# Patient Record
Sex: Male | Born: 1965 | Race: White | Hispanic: No | Smoking: Former smoker
Health system: Southern US, Community
[De-identification: ages and names within clinical notes are randomized; demographics above are authoritative.]

## PROBLEM LIST (undated history)

## (undated) DIAGNOSIS — F419 Anxiety disorder, unspecified: Secondary | ICD-10-CM

## (undated) DIAGNOSIS — E785 Hyperlipidemia, unspecified: Secondary | ICD-10-CM

## (undated) DIAGNOSIS — Z86010 Personal history of colonic polyps: Secondary | ICD-10-CM

## (undated) DIAGNOSIS — F329 Major depressive disorder, single episode, unspecified: Secondary | ICD-10-CM

## (undated) DIAGNOSIS — F32A Depression, unspecified: Secondary | ICD-10-CM

## (undated) HISTORY — DX: Hyperlipidemia, unspecified: E78.5

## (undated) HISTORY — DX: Depression, unspecified: F32.A

## (undated) HISTORY — PX: OTHER SURGICAL HISTORY: SHX169

## (undated) HISTORY — PX: SHOULDER ARTHROSCOPY: SHX128

## (undated) HISTORY — DX: Major depressive disorder, single episode, unspecified: F32.9

## (undated) HISTORY — PX: VASECTOMY: SHX75

---
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
## 2005-01-02 ENCOUNTER — Ambulatory Visit: Payer: Self-pay | Admitting: Unknown Physician Specialty

## 2005-04-13 ENCOUNTER — Ambulatory Visit: Payer: Self-pay

## 2005-07-13 ENCOUNTER — Ambulatory Visit: Payer: Self-pay | Admitting: Urology

## 2006-05-22 ENCOUNTER — Other Ambulatory Visit: Payer: Self-pay

## 2006-05-22 ENCOUNTER — Emergency Department: Payer: Self-pay | Admitting: Emergency Medicine

## 2007-11-12 ENCOUNTER — Ambulatory Visit: Payer: Self-pay | Admitting: Unknown Physician Specialty

## 2007-12-06 ENCOUNTER — Ambulatory Visit: Payer: Self-pay | Admitting: Unknown Physician Specialty

## 2008-02-20 ENCOUNTER — Ambulatory Visit: Payer: Self-pay

## 2008-04-09 ENCOUNTER — Ambulatory Visit: Payer: Self-pay | Admitting: Orthopaedic Surgery

## 2008-04-16 ENCOUNTER — Ambulatory Visit: Payer: Self-pay | Admitting: Orthopaedic Surgery

## 2014-02-17 DIAGNOSIS — K625 Hemorrhage of anus and rectum: Secondary | ICD-10-CM | POA: Insufficient documentation

## 2014-02-17 DIAGNOSIS — K602 Anal fissure, unspecified: Secondary | ICD-10-CM | POA: Insufficient documentation

## 2014-04-11 HISTORY — PX: COLONOSCOPY: SHX174

## 2014-04-28 DIAGNOSIS — Z86010 Personal history of colonic polyps: Secondary | ICD-10-CM

## 2014-04-28 DIAGNOSIS — Z860101 Personal history of adenomatous and serrated colon polyps: Secondary | ICD-10-CM | POA: Insufficient documentation

## 2014-04-28 HISTORY — DX: Personal history of colonic polyps: Z86.010

## 2014-04-28 HISTORY — DX: Personal history of adenomatous and serrated colon polyps: Z86.0101

## 2015-07-28 DIAGNOSIS — N50819 Testicular pain, unspecified: Secondary | ICD-10-CM | POA: Insufficient documentation

## 2015-07-28 DIAGNOSIS — Z9852 Vasectomy status: Secondary | ICD-10-CM | POA: Insufficient documentation

## 2015-11-22 ENCOUNTER — Other Ambulatory Visit: Payer: Self-pay | Admitting: Neurology

## 2015-11-22 DIAGNOSIS — R42 Dizziness and giddiness: Secondary | ICD-10-CM

## 2015-12-10 ENCOUNTER — Ambulatory Visit
Admission: RE | Admit: 2015-12-10 | Discharge: 2015-12-10 | Disposition: A | Discharge status: Home / Self Care | Payer: 59 | Source: Ambulatory Visit | Attending: Neurology | Admitting: Neurology

## 2015-12-10 DIAGNOSIS — R200 Anesthesia of skin: Secondary | ICD-10-CM | POA: Insufficient documentation

## 2015-12-10 DIAGNOSIS — R42 Dizziness and giddiness: Secondary | ICD-10-CM | POA: Insufficient documentation

## 2015-12-10 MED ORDER — GADOBENATE DIMEGLUMINE 529 MG/ML IV SOLN
15.0000 mL | Freq: Once | INTRAVENOUS | Status: AC | PRN
  Administered 2015-12-10: 15 mL via INTRAVENOUS

## 2016-02-21 ENCOUNTER — Other Ambulatory Visit: Payer: Self-pay

## 2016-02-21 ENCOUNTER — Emergency Department
Admission: EM | Admit: 2016-02-21 | Discharge: 2016-02-21 | Disposition: A | Discharge status: Home / Self Care | Payer: 59 | Attending: Emergency Medicine | Admitting: Emergency Medicine

## 2016-02-21 ENCOUNTER — Encounter: Payer: Self-pay | Admitting: Emergency Medicine

## 2016-02-21 DIAGNOSIS — R42 Dizziness and giddiness: Secondary | ICD-10-CM | POA: Diagnosis present

## 2016-02-21 DIAGNOSIS — N341 Nonspecific urethritis: Secondary | ICD-10-CM | POA: Diagnosis not present

## 2016-02-21 DIAGNOSIS — N342 Other urethritis: Secondary | ICD-10-CM

## 2016-02-21 HISTORY — DX: Anxiety disorder, unspecified: F41.9

## 2016-02-21 LAB — CBC
HCT: 44.2 % (ref 40.0–52.0)
Hemoglobin: 15.5 g/dL (ref 13.0–18.0)
MCH: 31.4 pg (ref 26.0–34.0)
MCHC: 35.1 g/dL (ref 32.0–36.0)
MCV: 89.5 fL (ref 80.0–100.0)
PLATELETS: 186 10*3/uL (ref 150–440)
RBC: 4.94 MIL/uL (ref 4.40–5.90)
RDW: 13.4 % (ref 11.5–14.5)
WBC: 7.3 10*3/uL (ref 3.8–10.6)

## 2016-02-21 LAB — URINALYSIS COMPLETE WITH MICROSCOPIC (ARMC ONLY)
Bacteria, UA: NONE SEEN
Bilirubin Urine: NEGATIVE
GLUCOSE, UA: NEGATIVE mg/dL
HGB URINE DIPSTICK: NEGATIVE
Ketones, ur: NEGATIVE mg/dL
Leukocytes, UA: NEGATIVE
Nitrite: NEGATIVE
Protein, ur: NEGATIVE mg/dL
SQUAMOUS EPITHELIAL / LPF: NONE SEEN
Specific Gravity, Urine: 1.015 (ref 1.005–1.030)
pH: 5 (ref 5.0–8.0)

## 2016-02-21 LAB — BASIC METABOLIC PANEL
ANION GAP: 9 (ref 5–15)
BUN: 17 mg/dL (ref 6–20)
CALCIUM: 9.3 mg/dL (ref 8.9–10.3)
CHLORIDE: 105 mmol/L (ref 101–111)
CO2: 24 mmol/L (ref 22–32)
CREATININE: 1.28 mg/dL — AB (ref 0.61–1.24)
GFR calc non Af Amer: >60 mL/min (ref >60)
Glucose, Bld: 168 mg/dL — ABNORMAL HIGH (ref 65–99)
Potassium: 3.9 mmol/L (ref 3.5–5.1)
SODIUM: 138 mmol/L (ref 135–145)

## 2016-02-21 LAB — CHLAMYDIA/NGC RT PCR (ARMC ONLY)
CHLAMYDIA TR: NOT DETECTED
N gonorrhoeae: NOT DETECTED

## 2016-02-21 MED ORDER — CEFTRIAXONE SODIUM 250 MG IJ SOLR
250.0000 mg | Freq: Once | INTRAMUSCULAR | Status: AC
Start: 2016-02-21 — End: 2016-02-21
  Administered 2016-02-21: 250 mg via INTRAMUSCULAR

## 2016-02-21 MED ORDER — CEFTRIAXONE SODIUM 250 MG IJ SOLR
INTRAMUSCULAR | Status: AC
  Administered 2016-02-21: 250 mg via INTRAMUSCULAR
  Filled 2016-02-21: qty 250

## 2016-02-21 NOTE — ED Provider Notes (Signed)
[facility] Emergency Department Provider Note  Time seen: 2:54 PM  I have reviewed the triage vital signs and the nursing notes.   HISTORY  Chief Complaint Near Syncope    HPI Patrick Neal is a 50 y.o. male with a past medical history of anxiety presents to the emergency department with urethral burning, and generalized weakness for the past 5 months. According to the patient since January he has been having intermittent urethral burning, denies discharge. He also states intermittent sore throat since January as well, denies fever, nausea, vomiting, diarrhea, abdominal pain. Patient states he was actually active in January and since that episode he has been having these symptoms intermittently. He has been working with his primary care doctor, they've treated him with Zithromax and doxycycline without improvement. Patient states the urethral burning was somewhat worse and he came to the emergency department today for evaluation. Denies any discharge. Denies any dysuria.     Past Medical History  Diagnosis Date  . Anxiety     There are no active problems to display for this patient.   Past Surgical History  Procedure Laterality Date  . Shoulder arthroscopy    . Visectomy      No current outpatient prescriptions on file.  Allergies Review of patient's allergies indicates no known allergies.  No family history on file.  Social History Social History  Substance Use Topics  . Smoking status: Never Smoker   . Smokeless tobacco: None  . Alcohol Use: 9.0 oz/week    15 Cans of beer per week     Comment: only on the weekends    Review of Systems Constitutional: Negative for fever. Cardiovascular: Negative for chest pain. Respiratory: Negative for shortness of breath. Gastrointestinal: Negative for abdominal pain Genitourinary: Negative for dysuria.Positive for urethral burning at times, but not during urination. Musculoskeletal: Negative  for back pain. Neurological: Negative for headache 10-point ROS otherwise negative.  ____________________________________________   PHYSICAL EXAM:  VITAL SIGNS: ED Triage Vitals  Enc Vitals Group     BP 02/21/16 1215 153/74 mmHg     Pulse Rate 02/21/16 1215 73     Resp 02/21/16 1215 18     Temp 02/21/16 1215 98.2 F (36.8 C)     Temp src --      SpO2 02/21/16 1215 98 %     Weight 02/21/16 1215 158 lb (71.668 kg)     Height 02/21/16 1215 5\' 9"  (1.753 m)     Head Cir --      Peak Flow --      Pain Score 02/21/16 1216 6     Pain Loc --      Pain Edu? --      Excl. in [place]? --     Constitutional: Alert and oriented. Well appearing and in no distress. Eyes: Normal exam ENT   Head: Normocephalic and atraumatic.   Mouth/Throat: Mucous membranes are moist.No pharyngeal erythema or exudate. Cardiovascular: Normal rate, regular rhythm. No murmur Respiratory: Normal respiratory effort without tachypnea nor retractions. Breath sounds are clear  Gastrointestinal: Soft and nontender. No distention.   Genitourinary: Normal examination. Normal testicular exam, no masses. No urethral discharge. No lesions identified. Normal circumcised exam. Musculoskeletal: Nontender with normal range of motion in all extremities. Neurologic:  Normal speech and language. No gross focal neurologic deficits  Skin:  Skin is warm, dry and intact.  Psychiatric: Mood and affect are normal.  ____________________________________________     INITIAL IMPRESSION / ASSESSMENT AND  PLAN / ED COURSE  Pertinent labs & imaging results that were available during my care of the patient were reviewed by me and considered in my medical decision making (see chart for details).  The patient presents for urethral burning, intermittent sore throat, tinnitus ML ongoing intermittently for the last 5 months since she was last sexually active. Patient states he has been treated with Zithromax and doxycycline by his primary  care doctor without improvement. States he's never been treated with a cephalosporin. Patient's labs are largely within normal limits. Patient has a normal physical exam. We will treat the patient with 250 mg of Rocephin. Patient received a Zithromax in February, he states 4 tablets at once, his last sexual encounter was in January. We will hold off on treatment with Zithromax, treat with Rocephin, send a GC chlamydia dirty urine sample, and have the patient follow up with his primary care doctor.  ____________________________________________   FINAL CLINICAL IMPRESSION(S) / ED DIAGNOSES  Urethritis   Harvest Dark, MD 02/21/16 1459

## 2016-02-21 NOTE — ED Notes (Addendum)
Pt to ED with increasing dizziness over the last few weeks.  Pt also states he has had "what feels like a virus for months", including sore throat and burning urethra (not with urination).  Denies n/v/d.  Pt endorses decreased appetite and has lost 5lbs in the last 2 weeks.

## 2016-02-21 NOTE — Discharge Instructions (Signed)
Urethritis, Adult °Urethritis is an inflammation of the tube through which urine exits your bladder (urethra).  °CAUSES °Urethritis is often caused by an infection in your urethra. The infection can be viral, like herpes. The infection can also be bacterial, like gonorrhea. °RISK FACTORS °Risk factors of urethritis include: °· Having sex without using a condom. °· Having multiple sexual partners. °· Having poor hygiene. °SIGNS AND SYMPTOMS °Symptoms of urethritis are less noticeable in women than in men. These symptoms include: °· Burning feeling when you urinate (dysuria). °· Discharge from your urethra. °· Blood in your urine (hematuria). °· Urinating more than usual. °DIAGNOSIS  °To confirm a diagnosis of urethritis, your health care provider will do the following: °· Ask about your sexual history. °· Perform a physical exam. °· Have you provide a sample of your urine for lab testing. °· Use a cotton swab to gently collect a sample from your urethra for lab testing. °TREATMENT  °It is important to treat urethritis. Depending on the cause, untreated urethritis may lead to serious genital infections and possibly infertility. Urethritis caused by a bacterial infection is treated with antibiotic medicine. All sexual partners must be treated.  °HOME CARE INSTRUCTIONS °· Do not have sex until the test results are known and treatment is completed, even if your symptoms go away before you finish treatment. °· If you were prescribed an antibiotic, finish it all even if you start to feel better. °SEEK MEDICAL CARE IF:  °· Your symptoms are not improved in 3 days. °· Your symptoms are getting worse. °· You develop abdominal pain or pelvic pain (in women). °· You develop joint pain. °· You have a fever. °SEEK IMMEDIATE MEDICAL CARE IF:  °· You have severe pain in the belly, back, or side. °· You have repeated vomiting. °MAKE SURE YOU: °· Understand these instructions. °· Will watch your condition. °· Will get help right away  if you are not doing well or get worse. °  °This information is not intended to replace advice given to you by your health care provider. Make sure you discuss any questions you have with your health care provider. °  °Document Released: 02/21/2001 Document Revised: 01/12/2015 Document Reviewed: 04/28/2013 °Elsevier Interactive Patient Education ©2016 [employer] ° °

## 2016-08-08 ENCOUNTER — Encounter: Payer: Self-pay | Admitting: Psychiatry

## 2016-08-08 ENCOUNTER — Ambulatory Visit (INDEPENDENT_AMBULATORY_CARE_PROVIDER_SITE_OTHER): Payer: 59 | Admitting: Psychiatry

## 2016-08-08 VITALS — BP 121/77 | HR 63 | Temp 97.1°F | Resp 16 | Wt 160.6 lb

## 2016-08-08 DIAGNOSIS — F330 Major depressive disorder, recurrent, mild: Secondary | ICD-10-CM | POA: Diagnosis not present

## 2016-08-08 DIAGNOSIS — F5101 Primary insomnia: Secondary | ICD-10-CM | POA: Diagnosis not present

## 2016-08-08 DIAGNOSIS — F411 Generalized anxiety disorder: Secondary | ICD-10-CM

## 2016-08-08 MED ORDER — QUETIAPINE FUMARATE 25 MG PO TABS: 25.0000 mg | ORAL_TABLET | Freq: Every day | ORAL | 2 refills | Status: DC

## 2016-08-08 NOTE — Progress Notes (Signed)
Psychiatric Initial Adult Assessment   Patient Identification: Patrick Neal MRN:  [id number] Date of Evaluation:  08/08/2016 Referral Source: Carolin Guernsey Chief Complaint:   Chief Complaint    Stress; Depression     Visit Diagnosis:    ICD-9-CM ICD-10-CM   1. GAD (generalized anxiety disorder) 300.02 F41.1   2. 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 Primary insomnia 307.42 F51.01     History of Present Illness:  Patient is a 50 year old Caucasian male who presents today for an evaluation for depression and insomnia. Patient reports that he has had insomnia for a long time starting when he was 50 years old. States that he has been treated with various medications including trazodone which did not work for him. Currently he takes Ambien 10 mg. Patient reports that over the last 2 years since he got divorced things have been a bit more  stressful. States states that he has a good job but finances are tight and he lives with his 20 year old son. He states that he and his ex-wife have joint custody of their son but it's an amicable relationship. Currently patient is endorsing depressed mood. Reports anhedonia and not enjoying anything in life. States that he does not feel like his motivation to do anything. States that he feels like he just sits on the couch most of the time. He is also busy taking care of his son. He is able to function at his job. He does not endorse active suicidal thoughts but states that he does not care if he would've no more. States he has been depressed for a long time. Reports that nobody in his family including his parents and siblings have anxiety or depression and do not understand what he is going through. Patient has never been hospitalized psychiatrically. His never had any suicide attempts. He's been on a few antidepressants with the sexual side effects. Denies abuse of alcohol or other drugs. Denies any  psychotic symptoms. Patient endorses significant anxiety. States that when he lays in bed his mind is constantly racing and he cannot shut it down. States there was a period of time and he did not sleep for almost 90 days. States that the Ambien helps him to sleep but he feels cloudy all the next day.     Past Psychiatric History:   Previous Psychotropic Medications: Yes  Paxil- very sleepy, sexual side effects, Trazodone- headache, fever like hangover, Zoloft- sexual side effects.  Substance Abuse History in the last 12 months:  No.  Consequences of Substance Abuse: Negative  Past Medical History:  Past Medical History:  Diagnosis Date  . Anxiety   . Depression     Past Surgical History:  Procedure Laterality Date  . SHOULDER ARTHROSCOPY    . visectomy      Family Psychiatric History:   Family History: No family history on file.  Social History:   Social History   Social History  . Marital status: Single    Spouse name: N/A  . Number of children: N/A  . Years of education: N/A   Social History Main Topics  . Smoking status: Never Smoker  . Smokeless tobacco: None  . Alcohol use 9.0 oz/week    15 Cans of beer per week     Comment: only on the weekends  . Drug use: Unknown  . Sexual activity: Not Asked   Other Topics Concern  . None   Social History Narrative  . None  Additional Social History: Divorced and lives with his 3 yo son.  Allergies:  No Known Allergies  Metabolic Disorder Labs: No results found for: HGBA1C, MPG No results found for: PROLACTIN No results found for: CHOL, TRIG, HDL, CHOLHDL, VLDL, LDLCALC   Current Medications: Current Outpatient Prescriptions  Medication Sig Dispense Refill  . zolpidem (AMBIEN) 10 MG tablet Take 10 mg by mouth at bedtime as needed for sleep.     No current facility-administered medications for this visit.     Neurologic: Headache: No Seizure: No Paresthesias:No  Musculoskeletal: Strength &  Muscle Tone: within normal limits Gait & Station: normal Patient leans: N/A  Psychiatric Specialty Exam: ROS  Blood pressure 121/77, pulse 63, temperature 97.1 F (36.2 C), temperature source Tympanic, resp. rate 16, weight 160 lb 9.6 oz (72.8 kg), SpO2 96 %.Body mass index is 23.72 kg/m.  General Appearance: Casual  Eye Contact:  Fair  Speech:  Clear and Coherent  Volume:  Normal  Mood:  Anxious, Depressed and Dysphoric  Affect:  Constricted, Depressed and Flat  Thought Process:  Coherent  Orientation:  Full (Time, Place, and Person)  Thought Content:  WDL  Suicidal Thoughts:  No  Homicidal Thoughts:  No  Memory:  Immediate;   Fair Recent;   Fair Remote;   Fair  Judgement:  Fair  Insight:  Fair  Psychomotor Activity:  Normal  Concentration:  Concentration: Fair and Attention Span: Fair  Recall:  [employer] of Knowledge:Fair  Language: Fair  Akathisia:  No  Handed:  Right  AIMS (if indicated):  na  Assets:  Communication Skills Desire for Improvement Housing Resilience Vocational/Educational  ADL's:  Intact  Cognition: WNL  Sleep:  Okay with ambien    Treatment Plan Summary: Medication management and Plan  Major depressive disorder moderate Start Seroquel at 25 mg at bedtime. Patient made aware of the side effects off of sleepiness and some weight gain. His aware of side effects of metabolic comp changes and long-term movement changes.  Anxiety disorder Same as above and patient will start seeing a therapist once his medications are stabilized  Insomnia Discontinue Ambien for now Will monitor patient on the Seroquel and see how it helps with his sleep  Return to clinic in 2 weeks time or call before if necessary   Bobbi Yount, MD 11/28/20172:11 PM

## 2016-08-17 DIAGNOSIS — M79644 Pain in right finger(s): Secondary | ICD-10-CM | POA: Insufficient documentation

## 2016-08-17 DIAGNOSIS — A6002 Herpesviral infection of other male genital organs: Secondary | ICD-10-CM | POA: Insufficient documentation

## 2016-08-28 ENCOUNTER — Ambulatory Visit (INDEPENDENT_AMBULATORY_CARE_PROVIDER_SITE_OTHER): Payer: 59 | Admitting: Psychiatry

## 2016-08-28 ENCOUNTER — Encounter: Payer: Self-pay | Admitting: Psychiatry

## 2016-08-28 VITALS — BP 136/71 | HR 69 | Temp 98.5°F | Wt 165.2 lb

## 2016-08-28 DIAGNOSIS — F330 Major depressive disorder, recurrent, mild: Secondary | ICD-10-CM

## 2016-08-28 DIAGNOSIS — F411 Generalized anxiety disorder: Secondary | ICD-10-CM | POA: Diagnosis not present

## 2016-08-28 DIAGNOSIS — F5101 Primary insomnia: Secondary | ICD-10-CM

## 2016-08-28 NOTE — Progress Notes (Signed)
Psychiatric progress note  Patient Identification: Patrick Neal MRN:  [id number] Date of Evaluation:  08/28/2016 Referral Source: Carolin Guernsey Chief Complaint:   Chief Complaint    Follow-up; Medication Refill     Visit Diagnosis:    ICD-9-CM ICD-10-CM   1. 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 GAD (generalized anxiety disorder) 300.02 F41.1   3. Primary insomnia 307.42 F51.01     History of Present Illness:  Patient is a 50 year old Caucasian male who presents today for a Follow-up of depression and anxiety. Patient reports that he started the Seroquel at 12.5 mg at bedtime. States that he has noticed an improvement in his anxiety and mood. States that he does not have the panic-like symptoms constantly that he has had for years. He is also sleeping better without the Ambien. Reports that his palpitations have decreased but he feels different. Denies any suicidal thoughts. He is a bit apprehensive but willing to give the 25 mg a trial. We discussed taking 12.5 mg twice daily  or just taking 25 mg at bedtime. Overall patient reports feeling much better than before.  Past Psychiatric History: Patient has never been hospitalized previously  Previous Psychotropic Medications: Yes  Paxil- very sleepy, sexual side effects, Trazodone- headache, fever like hangover, Zoloft- sexual side effects.  Substance Abuse History in the last 12 months:  No.  Consequences of Substance Abuse: Negative  Past Medical History:  Past Medical History:  Diagnosis Date  . Anxiety   . Depression     Past Surgical History:  Procedure Laterality Date  . SHOULDER ARTHROSCOPY    . visectomy      Family Psychiatric History:   Family History: History reviewed. No pertinent family history.  Social History:   Social History   Social History  . Marital status: Single    Spouse name: N/A  . Number of children: N/A  . Years of education: N/A    Social History Main Topics  . Smoking status: Never Smoker  . Smokeless tobacco: Never Used  . Alcohol use 9.0 oz/week    15 Cans of beer per week     Comment: only on the weekends  . Drug use: No  . Sexual activity: Yes   Other Topics Concern  . None   Social History Narrative  . None    Additional Social History: Divorced and lives with his 44 yo son.  Allergies:   Allergies  Allergen Reactions  . Pollen Extract     Metabolic Disorder Labs: No results found for: HGBA1C, MPG No results found for: PROLACTIN No results found for: CHOL, TRIG, HDL, CHOLHDL, VLDL, LDLCALC   Current Medications: Current Outpatient Prescriptions  Medication Sig Dispense Refill  . QUEtiapine (SEROQUEL) 25 MG tablet Take 1 tablet (25 mg total) by mouth at bedtime. 30 tablet 2  . zolpidem (AMBIEN) 10 MG tablet Take 10 mg by mouth at bedtime as needed for sleep.     No current facility-administered medications for this visit.     Neurologic: Headache: No Seizure: No Paresthesias:No  Musculoskeletal: Strength & Muscle Tone: within normal limits Gait & Station: normal Patient leans: N/A  Psychiatric Specialty Exam: ROS  Blood pressure 136/71, pulse 69, temperature 98.5 F (36.9 C), temperature source Oral, weight 165 lb 3.2 oz (74.9 kg).Body mass index is 24.4 kg/m.  General Appearance: Casual  Eye Contact:  Fair  Speech:  Clear and Coherent  Volume:  Normal  Mood:  Improved  Affect:  Improved and patient smiling today   Thought Process:  Coherent  Orientation:  Full (Time, Place, and Person)  Thought Content:  WDL  Suicidal Thoughts:  No  Homicidal Thoughts:  No  Memory:  Immediate;   Fair Recent;   Fair Remote;   Fair  Judgement:  Fair  Insight:  Fair  Psychomotor Activity:  Normal  Concentration:  Concentration: Fair and Attention Span: Fair  Recall:  [employer] of Knowledge:Fair  Language: Fair  Akathisia:  No  Handed:  Right  AIMS (if indicated):  na   Assets:  Communication Skills Desire for Improvement Housing Resilience Vocational/Educational  ADL's:  Intact  Cognition: WNL  Sleep:  Fair     Treatment Plan Summary: Medication management and Plan  Major depressive disorder moderate Continue Seroquel at 12.5 mg at bedtime and patient encouraged to increase it to 25 mg and see how he does.  Anxiety disorder Same as above and patient will start seeing a therapist once his medications are stabilized  Insomnia Will monitor patient on the Seroquel and see how it helps with his sleep  Return to clinic in 6 weeks time or call before if necessary   Abdulrahman Bracey, Blair Dolphin, MD 12/18/20172:12 PM

## 2016-09-27 IMAGING — MR MR HEAD WO/W CM
12 series · 48 of 48 positions shown · IV contrast (multihance)
Comparison: None.

CLINICAL DATA: 49-year-old male with dizziness for 6 months.
Initial encounter.

EXAM:
MRI HEAD WITHOUT AND WITH CONTRAST
TECHNIQUE: Multiplanar, multiecho pulse sequences of the brain and surrounding
structures were obtained without and with intravenous contrast.
CONTRAST:  15mL MULTIHANCE GADOBENATE DIMEGLUMINE 529 MG/ML IV SOLN

[Series 2: T1 · sagittal · 5.0mm · 0.45mm/px · 3 of 25 slices shown (1 of 2)]
[im 1/25]
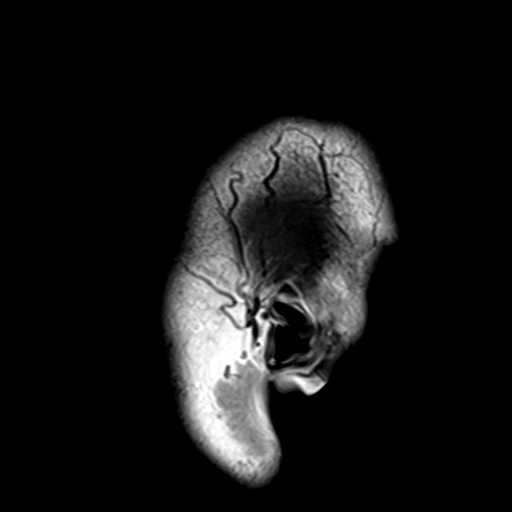
[im 13/25]
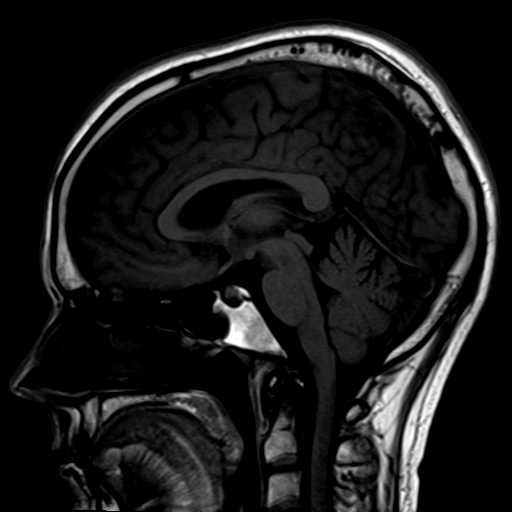
[im 25/25]
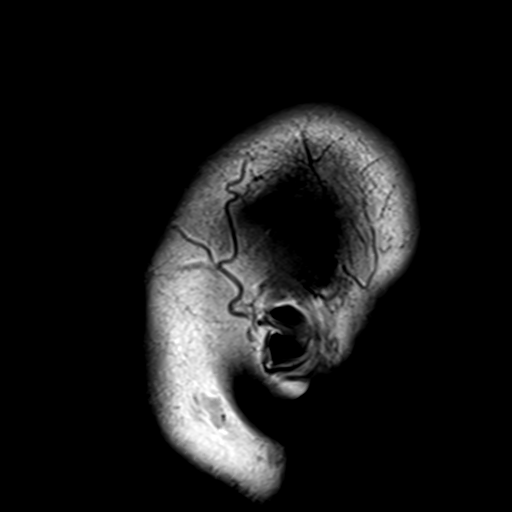

[Series 4: DWI · axial · 3.0mm · 1.80mm/px · z∈[-81,+77]mm · 5 of 53 slices shown (1 of 4)]
[im 1/53]
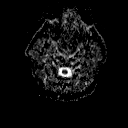
[im 14/53]
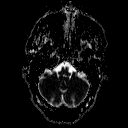
[im 27/53]
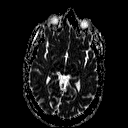
[im 40/53]
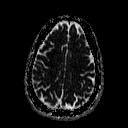
[im 53/53]
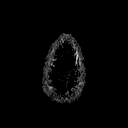

[Series 6: DWI · coronal · 3.0mm · 1.80mm/px · 4 of 45 slices shown (2 of 4)]
[im 1/45]
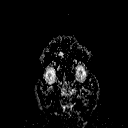
[im 15/45]
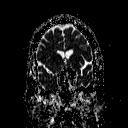
[im 30/45]
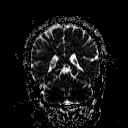
[im 45/45]
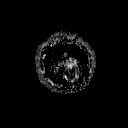

[Series 7: T2 · axial · 5.0mm · 0.60mm/px · z∈[-84,+81]mm · 3 of 27 slices shown (1 of 2)]
[im 1/27]
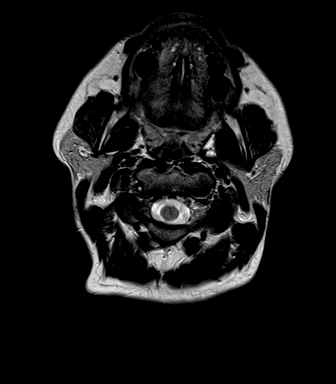
[im 14/27]
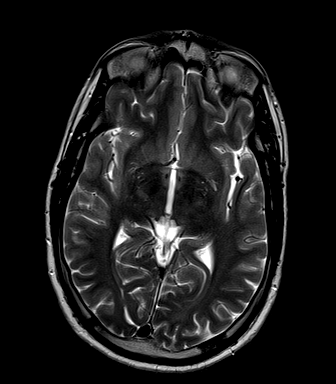
[im 27/27]
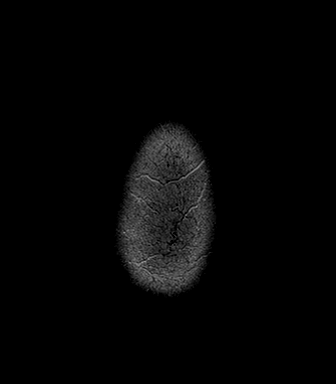

[Series 8: FLAIR · axial · 5.0mm · 0.45mm/px · z∈[-84,+81]mm · 3 of 27 slices shown]
[im 1/27]
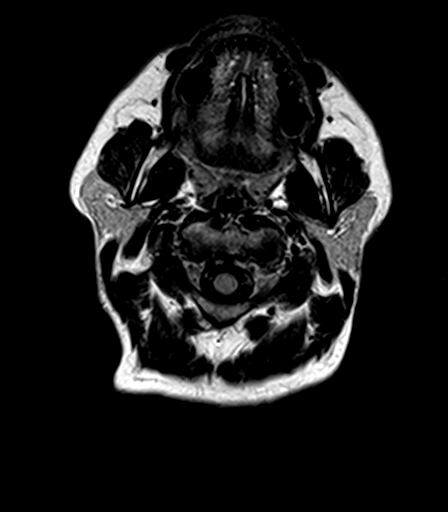
[im 14/27]
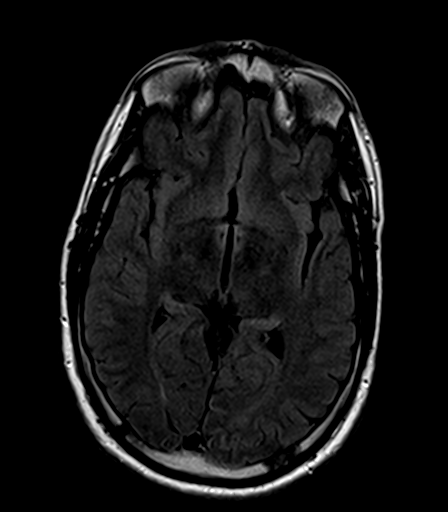
[im 27/27]
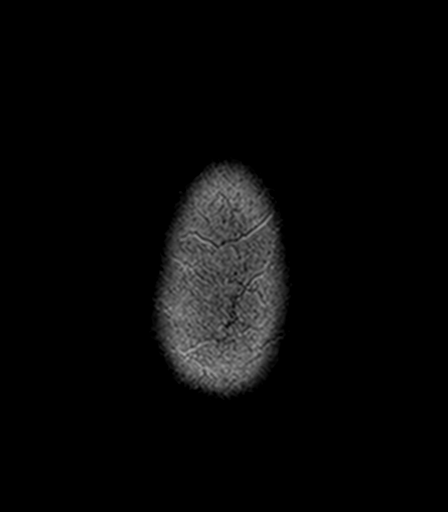

[Series 9: T2 · axial · 5.0mm · 0.45mm/px · z∈[-84,+81]mm · 3 of 27 slices shown (2 of 2)]
[im 1/27]
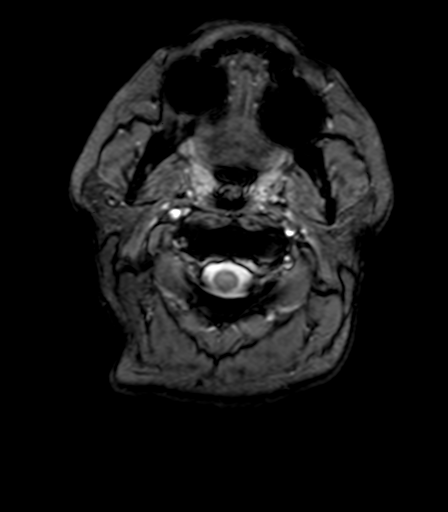
[im 14/27]
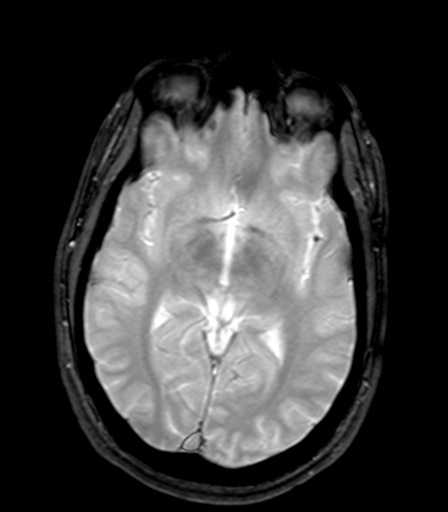
[im 27/27]
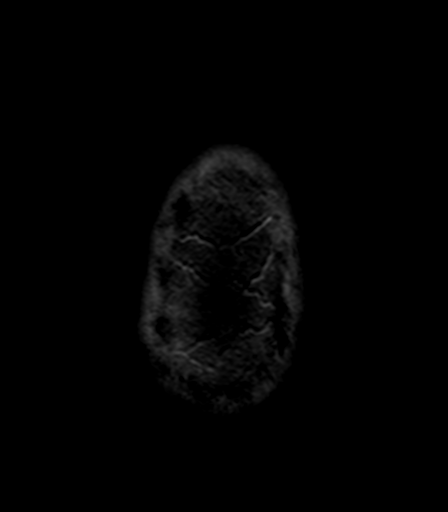

[Series 10: T1 · axial · 3.0mm · 1.00mm/px · z∈[-90,+83]mm · 6 of 60 slices shown (2 of 2)]
[im 1/60]
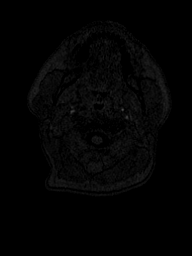
[im 12/60]
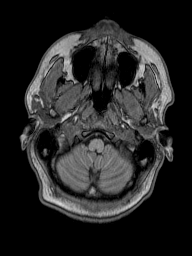
[im 24/60]
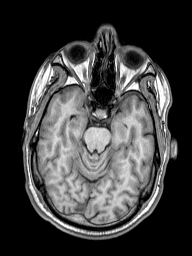
[im 36/60]
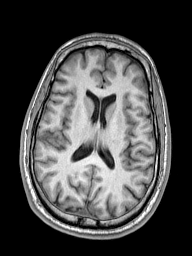
[im 48/60]
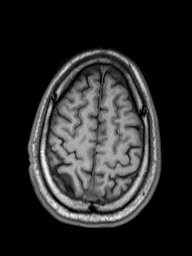
[im 60/60]
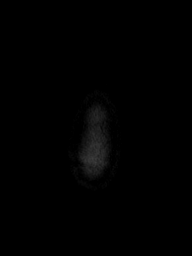

[Series 11: T2 post-contrast · coronal · 5.0mm · 0.49mm/px · 3 of 29 slices shown]
[im 1/29]
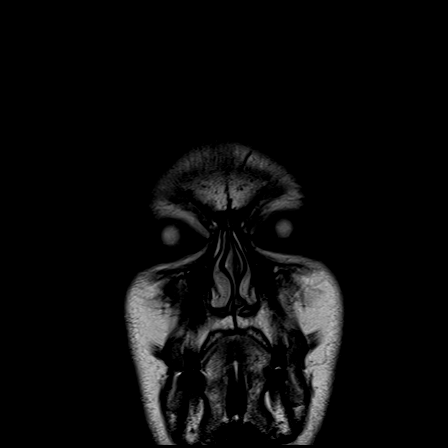
[im 15/29]
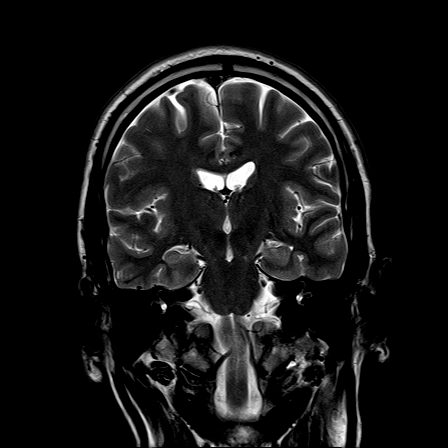
[im 29/29]
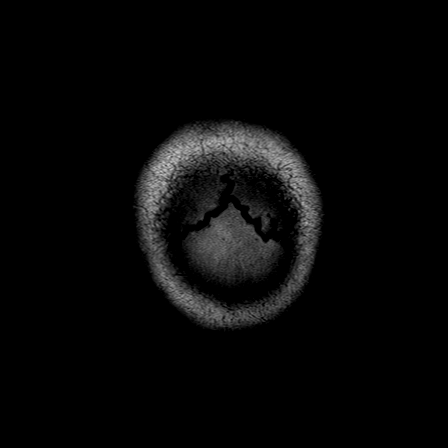

[Series 12: T1 post-contrast · axial · 3.0mm · 1.00mm/px · z∈[-90,+83]mm · 6 of 60 slices shown (1 of 2)]
[im 1/60]
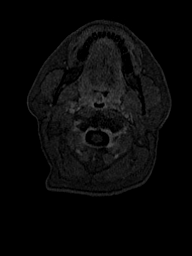
[im 12/60]
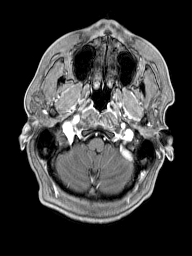
[im 24/60]
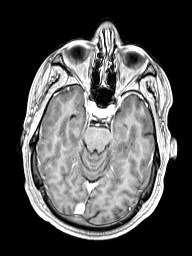
[im 36/60]
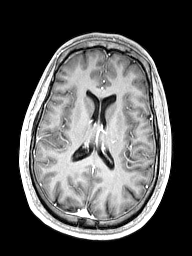
[im 48/60]
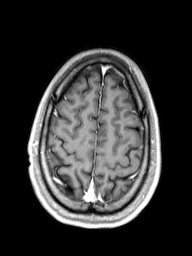
[im 60/60]
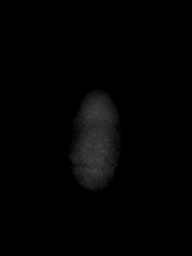

[Series 13: T1 post-contrast · coronal · 5.0mm · 0.43mm/px · 3 of 29 slices shown (2 of 2)]
[im 1/29]
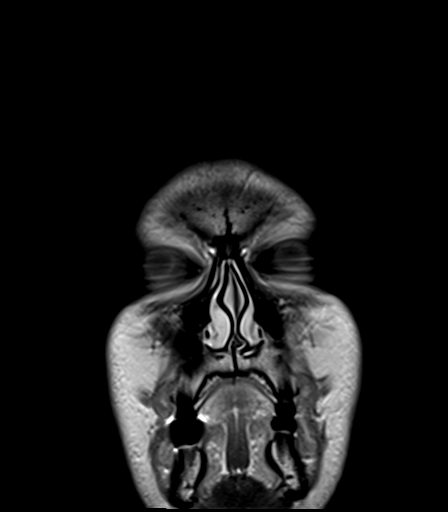
[im 15/29]
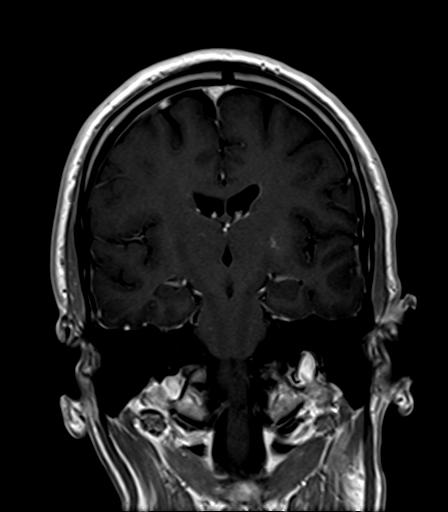
[im 29/29]
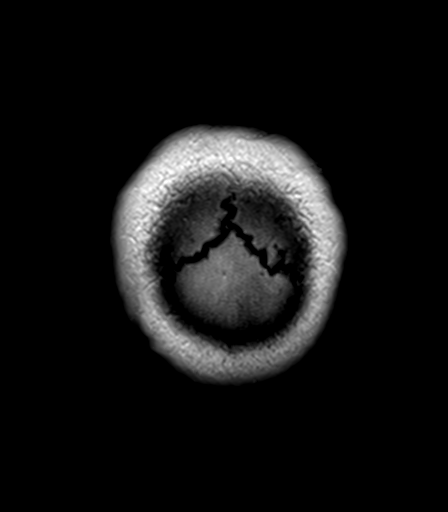

[Series 100: DWI · axial · 3.0mm · 1.80mm/px · z∈[-81,+77]mm · 5 of 55 slices shown (3 of 4)]
[im 1/55]
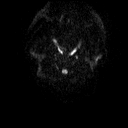
[im 14/55]
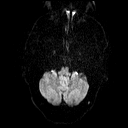
[im 28/55]
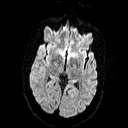
[im 41/55]
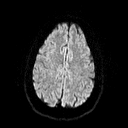
[im 55/55]
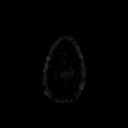

[Series 101: DWI · coronal · 3.0mm · 1.80mm/px · 4 of 45 slices shown (4 of 4)]
[im 1/45]
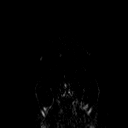
[im 15/45]
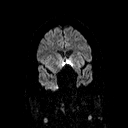
[im 30/45]
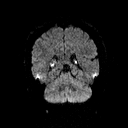
[im 45/45]
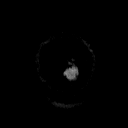

[48 of 48 positions shown; findings below may reference images not displayed]

FINDINGS: Normal cerebral volume. No restricted diffusion to suggest acute
infarction. No midline shift, mass effect, evidence of mass lesion,
ventriculomegaly, extra-axial collection or acute intracranial
hemorrhage. Cervicomedullary junction and pituitary are within
normal limits. Major intracranial vascular flow voids are within
normal limits. Gray and white matter signal is within normal limits
for age throughout the brain. No chronic cerebral blood products. No
abnormal enhancement identified ; incidental left deep gray matter
nucleus developmental venous anomaly (series 13, image 14. No
abnormal enhancement. No dural thickening.

Visible internal auditory structures appear normal. Mastoids are
clear.

Trace ethmoid sinus mucosal thickening. Other paranasal sinuses are
clear. Orbit and scalp soft tissues appear normal. Normal bone
marrow signal. Negative visualized cervical spine.
IMPRESSION: Normal MRI appearance of the brain.

## 2016-10-03 ENCOUNTER — Ambulatory Visit: Payer: 59 | Admitting: Psychiatry

## 2016-10-30 ENCOUNTER — Other Ambulatory Visit: Payer: Self-pay

## 2016-10-30 ENCOUNTER — Other Ambulatory Visit (HOSPITAL_COMMUNITY): Payer: Self-pay | Admitting: Psychiatry

## 2016-10-30 MED ORDER — QUETIAPINE FUMARATE 25 MG PO TABS: 25.0000 mg | ORAL_TABLET | Freq: Every day | ORAL | 2 refills | Status: DC

## 2016-10-30 MED ORDER — ZOLPIDEM TARTRATE 10 MG PO TABS: 10.0000 mg | ORAL_TABLET | Freq: Every evening | ORAL | 1 refills | Status: DC | PRN

## 2016-10-30 NOTE — Telephone Encounter (Signed)
Faxed and confirmed rx for ambien 10 mg id [id number] order # [id number]  and seroquel 25mg  id # [id number] order # [id number]

## 2016-10-30 NOTE — Telephone Encounter (Signed)
pt called states that you are out of network with his insurance and that he has to pay $800 to see you. He has an appt to see you this coming up wednesday but wanted to find out if you would extend his refills on his medication for another 3-4 months.  Pt states that he is doing well on the medications and would like to see if you would refill and have him come in later.

## 2016-10-30 NOTE — Telephone Encounter (Signed)
Pt called and told that rxs were faxed over to [facility] and he can r/s appt for 2-3 months out.Marland Kitchen

## 2016-11-01 ENCOUNTER — Ambulatory Visit: Payer: 59 | Admitting: Psychiatry

## 2017-01-29 ENCOUNTER — Ambulatory Visit: Payer: 59 | Admitting: Psychiatry

## 2017-01-30 ENCOUNTER — Ambulatory Visit: Payer: 59 | Admitting: Psychiatry

## 2017-02-28 ENCOUNTER — Ambulatory Visit (INDEPENDENT_AMBULATORY_CARE_PROVIDER_SITE_OTHER): Payer: 59 | Admitting: Psychiatry

## 2017-02-28 ENCOUNTER — Encounter: Payer: Self-pay | Admitting: Psychiatry

## 2017-02-28 VITALS — BP 115/73 | HR 57 | Temp 98.7°F | Wt 171.0 lb

## 2017-02-28 DIAGNOSIS — F411 Generalized anxiety disorder: Secondary | ICD-10-CM | POA: Diagnosis not present

## 2017-02-28 DIAGNOSIS — F330 Major depressive disorder, recurrent, mild: Secondary | ICD-10-CM | POA: Diagnosis not present

## 2017-02-28 MED ORDER — ZOLPIDEM TARTRATE 10 MG PO TABS: 10.0000 mg | ORAL_TABLET | Freq: Every evening | ORAL | 1 refills | Status: DC | PRN

## 2017-02-28 MED ORDER — SERTRALINE HCL 25 MG PO TABS: 25.0000 mg | ORAL_TABLET | Freq: Every day | ORAL | 1 refills | Status: DC

## 2017-02-28 NOTE — Progress Notes (Signed)
Psychiatric progress note  Patient Identification: Patrick Neal MRN:  [id number] Date of Evaluation:  02/28/2017 Referral Source: Carolin Guernsey Chief Complaint:   Chief Complaint    Follow-up; Medication Problem; Stress; Insomnia     Visit Diagnosis:    ICD-10-CM   1. MDD (major depressive disorder), recurrent episode, mild ([place]) F33.0   2. GAD (generalized anxiety disorder) F41.1     History of Present Illness:  Patient is a 51 year old Caucasian male who presents today for a Follow-up of depression and anxiety. Patient has not been seen in 6 months, states this clinician was out of network. He reports feeling stressed more recently, feels the seroquel has not been helping. He is sleeping [place] on [place]. Patient reports that the smallest things stress him out. Patient is very wavy of most medications discussed with him. We discussed restarting the Zoloft and seeing how it helps. Denies any suicidal thoughts.  Past Psychiatric History: Patient has never been hospitalized previously  Previous Psychotropic Medications: Yes  Paxil- very sleepy, sexual side effects, Trazodone- headache, fever like hangover, Zoloft- sexual side effects.  Substance Abuse History in the last 12 months:  No.  Consequences of Substance Abuse: Negative  Past Medical History:  Past Medical History:  Diagnosis Date  . Anxiety   . Depression     Past Surgical History:  Procedure Laterality Date  . SHOULDER ARTHROSCOPY    . visectomy      Family Psychiatric History:   Family History: History reviewed. No pertinent family history.  Social History:   Social History   Social History  . Marital status: Single    Spouse name: N/A  . Number of children: N/A  . Years of education: N/A   Social History Main Topics  . Smoking status: Never Smoker  . Smokeless tobacco: Never Used  . Alcohol use 9.0 oz/week    15 Cans of beer per week     Comment: only on the weekends  . Drug use:  No  . Sexual activity: Yes   Other Topics Concern  . None   Social History Narrative  . None    Additional Social History: Divorced and lives with his 62 yo son.  Allergies:   Allergies  Allergen Reactions  . Pollen Extract     Metabolic Disorder Labs: No results found for: HGBA1C, MPG No results found for: PROLACTIN No results found for: CHOL, TRIG, HDL, CHOLHDL, VLDL, LDLCALC   Current Medications: Current Outpatient Prescriptions  Medication Sig Dispense Refill  . QUEtiapine (SEROQUEL) 25 MG tablet Take 1 tablet (25 mg total) by mouth at bedtime. 30 tablet 2  . zolpidem (AMBIEN) 10 MG tablet Take 1 tablet (10 mg total) by mouth at bedtime as needed for sleep. 30 tablet 1   No current facility-administered medications for this visit.     Neurologic: Headache: No Seizure: No Paresthesias:No  Musculoskeletal: Strength & Muscle Tone: within normal limits Gait & Station: normal Patient leans: N/A  Psychiatric Specialty Exam: ROS  Blood pressure 115/73, pulse (!) 57, temperature 98.7 F (37.1 C), temperature source Oral, weight 171 lb (77.6 kg).Body mass index is 25.25 kg/m.  General Appearance: Casual  Eye Contact:  Fair  Speech:  Clear and Coherent  Volume:  Normal  Mood:  ok  Affect:  Constricted, anxious  Thought Process:  Coherent  Orientation:  Full (Time, Place, and Person)  Thought Content:  WDL  Suicidal Thoughts:  No  Homicidal Thoughts:  No  Memory:  Immediate;   Fair Recent;   Fair Remote;   Fair  Judgement:  Fair  Insight:  Fair  Psychomotor Activity:  Normal  Concentration:  Concentration: Fair and Attention Span: Fair  Recall:  [employer] of Knowledge:Fair  Language: Fair  Akathisia:  No  Handed:  Right  AIMS (if indicated):  na  Assets:  Communication Skills Desire for Improvement Housing Resilience Vocational/Educational  ADL's:  Intact  Cognition: WNL  Sleep:  Fair     Treatment Plan Summary: Medication management and  Plan  Major depressive disorder moderate Discontinue the seroquel.. Start zoloft at 25mg  po qhs. Will consider starting Wellbutrin if patient has side effects on the Zoloft.  Anxiety disorder Same as above and patient will start seeing a therapist once his medications are stabilized  Insomnia Continue ambien at 10mg  po qhs.  Return to clinic in 4 weeks time or call before if necessary   Antwoin Lackey, Blair Dolphin, MD 6/20/20182:10 PM

## 2017-03-28 ENCOUNTER — Ambulatory Visit (INDEPENDENT_AMBULATORY_CARE_PROVIDER_SITE_OTHER): Payer: 59 | Admitting: Psychiatry

## 2017-03-28 ENCOUNTER — Encounter: Payer: Self-pay | Admitting: Psychiatry

## 2017-03-28 VITALS — BP 123/68 | HR 61 | Temp 98.7°F | Wt 167.8 lb

## 2017-03-28 DIAGNOSIS — F330 Major depressive disorder, recurrent, mild: Secondary | ICD-10-CM | POA: Diagnosis not present

## 2017-03-28 DIAGNOSIS — F411 Generalized anxiety disorder: Secondary | ICD-10-CM | POA: Diagnosis not present

## 2017-03-28 NOTE — Progress Notes (Signed)
Psychiatric progress note  Patient Identification: Patrick Neal MRN:  [id number] Date of Evaluation:  03/28/2017 Referral Source: Carolin Guernsey Chief Complaint:  Anxious, not sleeping well Chief Complaint    Follow-up; Medication Refill     Visit Diagnosis:    ICD-10-CM   1. MDD (major depressive disorder), recurrent episode, mild ([place]) F33.0   2. GAD (generalized anxiety disorder) F41.1     History of Present Illness:  Patient is a 51 year old Caucasian male who presents today for a Follow-up of depression and anxiety. Patient reports on the zoloft he felt calmer for first few days and then became very hyper and was unable to sleep. He had sexual side effects as well. Patient states that though Seroquel works it caused him a lot of constipation. He is still not seeing a therapist and states that his work is very stressful and he has a lot of for financial burdens to start seeing a therapist. Denies any suicidal thoughts. States that he does have some type of bipolar disorder. We discussed starting him on Latuda and if this does not work he will transition to another psychiatrist for second opinion.  Past Psychiatric History: Patient has never been hospitalized previously  Previous Psychotropic Medications: Yes  Paxil- very sleepy, sexual side effects, Trazodone- headache, fever like hangover, Zoloft- sexual side effects.  Substance Abuse History in the last 12 months:  No.  Consequences of Substance Abuse: Negative  Past Medical History:  Past Medical History:  Diagnosis Date  . Anxiety   . Depression     Past Surgical History:  Procedure Laterality Date  . SHOULDER ARTHROSCOPY    . visectomy      Family Psychiatric History:   Family History: History reviewed. No pertinent family history.  Social History:   Social History   Social History  . Marital status: Single    Spouse name: N/A  . Number of children: N/A  . Years of education: N/A   Social  History Main Topics  . Smoking status: Never Smoker  . Smokeless tobacco: Never Used  . Alcohol use 9.0 oz/week    15 Cans of beer per week     Comment: only on the weekends  . Drug use: No  . Sexual activity: Yes   Other Topics Concern  . None   Social History Narrative  . None    Additional Social History: Divorced and lives with his 59 yo son.  Allergies:   Allergies  Allergen Reactions  . Pollen Extract     Metabolic Disorder Labs: No results found for: HGBA1C, MPG No results found for: PROLACTIN No results found for: CHOL, TRIG, HDL, CHOLHDL, VLDL, LDLCALC   Current Medications: Current Outpatient Prescriptions  Medication Sig Dispense Refill  . sertraline (ZOLOFT) 25 MG tablet Take 1 tablet (25 mg total) by mouth daily. 30 tablet 1  . zolpidem (AMBIEN) 10 MG tablet Take 1 tablet (10 mg total) by mouth at bedtime as needed for sleep. 30 tablet 1   No current facility-administered medications for this visit.     Neurologic: Headache: No Seizure: No Paresthesias:No  Musculoskeletal: Strength & Muscle Tone: within normal limits Gait & Station: normal Patient leans: N/A  Psychiatric Specialty Exam: ROS  Blood pressure 123/68, pulse 61, temperature 98.7 F (37.1 C), temperature source Oral, weight 167 lb 12.8 oz (76.1 kg).Body mass index is 24.78 kg/m.  General Appearance: Casual  Eye Contact:  Fair  Speech:  Clear and Coherent  Volume:  Normal  Mood:  ok  Affect:  Constricted, anxious  Thought Process:  Coherent  Orientation:  Full (Time, Place, and Person)  Thought Content:  WDL  Suicidal Thoughts:  No  Homicidal Thoughts:  No  Memory:  Immediate;   Fair Recent;   Fair Remote;   Fair  Judgement:  Fair  Insight:  Fair  Psychomotor Activity:  Normal  Concentration:  Concentration: Fair and Attention Span: Fair  Recall:  [employer] of Knowledge:Fair  Language: Fair  Akathisia:  No  Handed:  Right  AIMS (if indicated):  na  Assets:   Communication Skills Desire for Improvement Housing Resilience Vocational/Educational  ADL's:  Intact  Cognition: WNL  Sleep:  Not good on the [employer]    Treatment Plan Summary: Medication management and Plan  Major depressive disorder moderate Discontinue zoloft. Start Latuda at 20mg  po qd. Discussed the side effects off probable weight gain, metabolic side effects and long-term movement issues. Patient gave his verbal consent Anxiety disorder Same as above and patient will start seeing a therapist once his medications are stabilized  Insomnia Continue ambien at 10mg  po qhs.  Return to clinic in 4 weeks time or call before if necessary   Elvin So, MD 7/18/20181:57 PM

## 2017-05-02 ENCOUNTER — Ambulatory Visit: Payer: 59 | Admitting: Psychiatry

## 2017-05-17 ENCOUNTER — Ambulatory Visit (INDEPENDENT_AMBULATORY_CARE_PROVIDER_SITE_OTHER): Payer: 59 | Admitting: Psychiatry

## 2017-05-17 ENCOUNTER — Encounter: Payer: Self-pay | Admitting: Psychiatry

## 2017-05-17 VITALS — BP 137/74 | HR 68 | Temp 98.6°F | Wt 169.4 lb

## 2017-05-17 DIAGNOSIS — F411 Generalized anxiety disorder: Secondary | ICD-10-CM

## 2017-05-17 DIAGNOSIS — F5101 Primary insomnia: Secondary | ICD-10-CM

## 2017-05-17 DIAGNOSIS — F330 Major depressive disorder, recurrent, mild: Secondary | ICD-10-CM | POA: Diagnosis not present

## 2017-05-17 MED ORDER — ZOLPIDEM TARTRATE 10 MG PO TABS: 10.0000 mg | ORAL_TABLET | Freq: Every evening | ORAL | 1 refills | Status: AC | PRN

## 2017-05-17 MED ORDER — QUETIAPINE FUMARATE 25 MG PO TABS: 25.0000 mg | ORAL_TABLET | Freq: Every day | ORAL | 2 refills | Status: DC

## 2017-05-17 NOTE — Progress Notes (Signed)
Psychiatric progress note  Patient Identification: Patrick Neal MRN:  [id number] Date of Evaluation:  05/17/2017 Referral Source: Carolin Guernsey Chief Complaint:  Continues to be anxious Chief Complaint    Follow-up; Medication Refill     Visit Diagnosis:    ICD-10-CM   1. MDD (major depressive disorder), recurrent episode, mild ([place]) F33.0   2. GAD (generalized anxiety disorder) F41.1   3. Primary insomnia F51.01     History of Present Illness:  Patient is a 51 year old Caucasian male who presents today for a Follow-up of depression and anxiety. Patient reports on the latuda he became very agitated and had to walk around. Reports his job is stressful. We discussed transitioning him to another physician. Patient is not interested in this as he states that most of his anxiety and stresses from his job situation and his financial stress. He reports it's mostly situational. 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  Past Psychiatric History: Patient has never been hospitalized previously  Previous Psychotropic Medications: Yes  Paxil- very sleepy, sexual side effects, Trazodone- headache, fever like hangover, Zoloft- sexual side effects.  Substance Abuse History in the last 12 months:  No.  Consequences of Substance Abuse: Negative  Past Medical History:  Past Medical History:  Diagnosis Date  . Anxiety   . Depression     Past Surgical History:  Procedure Laterality Date  . SHOULDER ARTHROSCOPY    . visectomy      Family Psychiatric History:   Family History: History reviewed. No pertinent family history.  Social History:   Social History   Social History  . Marital status: Single    Spouse name: N/A  . Number of children: N/A  . Years of education: N/A   Social History Main Topics  . Smoking status: Never Smoker  . Smokeless tobacco: Never Used  . Alcohol use 9.0 oz/week    15 Cans of beer per  week     Comment: only on the weekends  . Drug use: No  . Sexual activity: Yes   Other Topics Concern  . None   Social History Narrative  . None    Additional Social History: Divorced and lives with his 3 yo son.  Allergies:   Allergies  Allergen Reactions  . Pollen Extract     Metabolic Disorder Labs: No results found for: HGBA1C, MPG No results found for: PROLACTIN No results found for: CHOL, TRIG, HDL, CHOLHDL, VLDL, LDLCALC   Current Medications: Current Outpatient Prescriptions  Medication Sig Dispense Refill  . zolpidem (AMBIEN) 10 MG tablet Take 1 tablet (10 mg total) by mouth at bedtime as needed for sleep. 30 tablet 1   No current facility-administered medications for this visit.     Neurologic: Headache: No Seizure: No Paresthesias:No  Musculoskeletal: Strength & Muscle Tone: within normal limits Gait & Station: normal Patient leans: N/A  Psychiatric Specialty Exam: ROS  Blood pressure 137/74, pulse 68, temperature 98.6 F (37 C), temperature source Oral, weight 169 lb 6.4 oz (76.8 kg).Body mass index is 25.02 kg/m.  General Appearance: Casual  Eye Contact:  Fair  Speech:  Clear and Coherent  Volume:  Normal  Mood:  ok  Affect:  Constricted, anxious  Thought Process:  Coherent  Orientation:  Full (Time, Place, and Person)  Thought Content:  WDL  Suicidal Thoughts:  No  Homicidal Thoughts:  No  Memory:  Immediate;   Fair Recent;   Fair  Remote;   Fair  Judgement:  Fair  Insight:  Fair  Psychomotor Activity:  Normal  Concentration:  Concentration: Fair and Attention Span: Fair  Recall:  [employer] of Knowledge:Fair  Language: Fair  Akathisia:  No  Handed:  Right  AIMS (if indicated):  na  Assets:  Communication Skills Desire for Improvement Housing Resilience Vocational/Educational  ADL's:  Intact  Cognition: WNL  Sleep:  Not good    Treatment Plan Summary: Medication management and Plan  Major depressive disorder  moderate Discontinue Latuda  Anxiety disorder Restart Seroquel at 25 mg at bedtime to help with anxiety and also sleep.  Insomnia Continue ambien at 10mg  po qhs  Return to clinic in 3 months time or call before if necessary. Patient will follow-up with Dr.Eappen and he is made aware of this.   Elvin So, MD 9/6/20182:10 PM

## 2017-08-15 ENCOUNTER — Ambulatory Visit: Payer: 59 | Admitting: Psychiatry

## 2019-03-21 DIAGNOSIS — Z13220 Encounter for screening for lipoid disorders: Secondary | ICD-10-CM | POA: Diagnosis not present

## 2019-03-21 DIAGNOSIS — Z125 Encounter for screening for malignant neoplasm of prostate: Secondary | ICD-10-CM | POA: Diagnosis not present

## 2019-03-21 DIAGNOSIS — Z1389 Encounter for screening for other disorder: Secondary | ICD-10-CM | POA: Diagnosis not present

## 2019-03-21 DIAGNOSIS — Z0000 Encounter for general adult medical examination without abnormal findings: Secondary | ICD-10-CM | POA: Diagnosis not present

## 2019-03-24 DIAGNOSIS — D2230 Melanocytic nevi of unspecified part of face: Secondary | ICD-10-CM | POA: Diagnosis not present

## 2019-03-24 DIAGNOSIS — F5104 Psychophysiologic insomnia: Secondary | ICD-10-CM | POA: Diagnosis not present

## 2019-03-24 DIAGNOSIS — D485 Neoplasm of uncertain behavior of skin: Secondary | ICD-10-CM | POA: Diagnosis not present

## 2019-03-24 DIAGNOSIS — D225 Melanocytic nevi of trunk: Secondary | ICD-10-CM | POA: Diagnosis not present

## 2019-03-24 DIAGNOSIS — H6123 Impacted cerumen, bilateral: Secondary | ICD-10-CM | POA: Diagnosis not present

## 2019-03-24 DIAGNOSIS — Z1283 Encounter for screening for malignant neoplasm of skin: Secondary | ICD-10-CM | POA: Diagnosis not present

## 2019-03-24 DIAGNOSIS — Z0000 Encounter for general adult medical examination without abnormal findings: Secondary | ICD-10-CM | POA: Diagnosis not present

## 2019-03-24 DIAGNOSIS — F419 Anxiety disorder, unspecified: Secondary | ICD-10-CM | POA: Diagnosis not present

## 2019-03-24 DIAGNOSIS — L578 Other skin changes due to chronic exposure to nonionizing radiation: Secondary | ICD-10-CM | POA: Diagnosis not present

## 2019-03-24 DIAGNOSIS — D239 Other benign neoplasm of skin, unspecified: Secondary | ICD-10-CM

## 2019-03-24 HISTORY — DX: Other benign neoplasm of skin, unspecified: D23.9

## 2019-04-09 ENCOUNTER — Telehealth: Payer: Self-pay | Admitting: Internal Medicine

## 2019-04-09 NOTE — Telephone Encounter (Signed)
[place] [facility] faxed request to [facility] for medical records 04/09/19  [facility]

## 2019-04-11 ENCOUNTER — Telehealth: Payer: Self-pay | Admitting: Internal Medicine

## 2019-04-11 NOTE — Telephone Encounter (Signed)
Patient had records faxed over to our office since he moved from [place] to [place]. Patient said he is due for his colonoscopy and is requesting Dr. Carlean Purl. Records are sent to Dr. Carlean Purl for review

## 2019-04-14 NOTE — Telephone Encounter (Signed)
Please let the patient know I would be happy to do his colonoscopy - however he may not actually need one yet based upon new guidelines  I need to get a copy of the pathology report from the 04/28/2014 colonoscopy to seewhat type of polyp he had and make the correct follow-up decision based upon that and new guidelines  I have looked in Care everywhere and I cannot see the pathology report so we need a faxed copy sent to [place]

## 2019-04-15 NOTE — Telephone Encounter (Signed)
Faxed [facility] to request 2015 pathology report.  Pt aware that he will need to obtain records if our request does not go through.

## 2019-04-18 ENCOUNTER — Encounter: Payer: Self-pay | Admitting: Internal Medicine

## 2019-04-18 ENCOUNTER — Telehealth: Payer: Self-pay | Admitting: Internal Medicine

## 2019-04-18 NOTE — Telephone Encounter (Signed)
Pt aware of recall date.

## 2019-04-18 NOTE — Telephone Encounter (Signed)
Please explain to patient that I reviewed his colonoscopy and pathology report and when he he had it 5 years late or 2020 was correct follow-up but now the guidelines have changed so I recommend he wait until 04/2021 based upon repeating in 7-10 years.   So please place the recall for 04/2021

## 2020-03-10 ENCOUNTER — Telehealth: Payer: Self-pay | Admitting: Internal Medicine

## 2020-03-10 NOTE — Telephone Encounter (Signed)
Scheduled OV with Amy Esterwood on 04/06/20. Patient in agreement.

## 2020-03-10 NOTE — Telephone Encounter (Signed)
Am willing to do a colonoscopy but given the abdominal pain issues needs to nave an evaluation in office with me or an APP before we arrange that as could need other testing also

## 2020-03-10 NOTE — Telephone Encounter (Signed)
Called patient back and he states he would like to have a colonoscopy this year (which will be 6 years from his last one, instead of the 7 years recommended). States he learned that a first cousin died of colon CA at 44 yrs. He also says for several months he has been having small amts of bright red blood in the toilet and on the tissue, intermittently. Also c/o Bil. Lower abdominal pain that is constant, ranging from mild to sever. Denies n/v or fever. Has not had an office visit. Please advise

## 2020-03-19 DIAGNOSIS — Z0000 Encounter for general adult medical examination without abnormal findings: Secondary | ICD-10-CM | POA: Diagnosis not present

## 2020-03-19 DIAGNOSIS — Z13220 Encounter for screening for lipoid disorders: Secondary | ICD-10-CM | POA: Diagnosis not present

## 2020-03-19 DIAGNOSIS — Z1389 Encounter for screening for other disorder: Secondary | ICD-10-CM | POA: Diagnosis not present

## 2020-03-19 DIAGNOSIS — Z125 Encounter for screening for malignant neoplasm of prostate: Secondary | ICD-10-CM | POA: Diagnosis not present

## 2020-03-25 ENCOUNTER — Encounter: Payer: 59 | Admitting: Dermatology

## 2020-03-25 DIAGNOSIS — F419 Anxiety disorder, unspecified: Secondary | ICD-10-CM | POA: Diagnosis not present

## 2020-03-25 DIAGNOSIS — E785 Hyperlipidemia, unspecified: Secondary | ICD-10-CM | POA: Diagnosis not present

## 2020-03-25 DIAGNOSIS — G4700 Insomnia, unspecified: Secondary | ICD-10-CM | POA: Diagnosis not present

## 2020-03-25 DIAGNOSIS — Z0000 Encounter for general adult medical examination without abnormal findings: Secondary | ICD-10-CM | POA: Diagnosis not present

## 2020-04-06 ENCOUNTER — Encounter: Payer: Self-pay | Admitting: Physician Assistant

## 2020-04-06 ENCOUNTER — Ambulatory Visit (INDEPENDENT_AMBULATORY_CARE_PROVIDER_SITE_OTHER): Payer: BC Managed Care – PPO | Admitting: Physician Assistant

## 2020-04-06 VITALS — BP 120/82 | Ht 69.0 in | Wt 166.5 lb

## 2020-04-06 DIAGNOSIS — K625 Hemorrhage of anus and rectum: Secondary | ICD-10-CM | POA: Diagnosis not present

## 2020-04-06 DIAGNOSIS — R1030 Lower abdominal pain, unspecified: Secondary | ICD-10-CM

## 2020-04-06 DIAGNOSIS — Z86010 Personal history of colonic polyps: Secondary | ICD-10-CM | POA: Diagnosis not present

## 2020-04-06 NOTE — Patient Instructions (Addendum)
If you are age 52 or older, your body mass index should be between 23-30. Your Body mass index is 24.59 kg/m. If this is out of the aforementioned range listed, please consider follow up with your Primary Care Provider.  If you are age 59 or younger, your body mass index should be between 19-25. Your Body mass index is 24.59 kg/m. If this is out of the aformentioned range listed, please consider follow up with your Primary Care Provider.   You have been scheduled for a colonoscopy. Please follow written instructions given to you at your visit today.  Please pick up your prep supplies at the pharmacy within the next 1-3 days. If you use inhalers (even only as needed), please bring them with you on the day of your procedure.  Follow up pending the results of your Colonoscopy.

## 2020-04-06 NOTE — Progress Notes (Signed)
Subjective:    Patient ID: Patrick Neal, male    DOB: 1965-10-10, 54 y.o.   MRN: [id number]  HPI  Patrick Neal is a 54 year old white male, who would like to be established with Patrick Neal, as his parents have been patients here.  He comes in with concerns regarding vague lower abdominal discomfort present over the past year or so.  He describes this as an irritated or inflamed feeling in his lower abdomen which is present most of the time.  He says he has always had some sensation since he has been an adult of a "crammed" feeling in his lower abdomen.  He feels that twisting sometimes aggravates his lower abdomen, and also notices symptoms with running.  He thinks that sometimes the bouncing motion of running bothers his lower abdomen.  He says he is been eating a lot of salads lately and has been having more frequent bowel movements but on a typical day will have 3 bowel movements.  He has noticed intermittent bright red blood sporadically over the past several years which she attributes to a fissure and hemorrhoids.  He has not noticed any increase in GI symptoms with p.o. intake or with bowel movements.  Appetite has been fine.  Weight is down about 10 pounds over the past year or so but says he has been running more. Patient reports that he had labs done within the past few weeks by his PCP Patrick Neal.  These are visible in care everywhere, CBC and c-Met were unremarkable. Patient has history of an adenomatous colon polyp on colonoscopy done in 2015 per Patrick Neal.  Also with internal hemorrhoids.  He was advised to have 5-year interval follow-up. No heartburn or indigestion, no dysphagia or other upper GI symptoms.  No regular aspirin or NSAID use.   Review of Systems.Pertinent positive and negative review of systems were noted in the above HPI section.  All other review of systems was otherwise negative.  Outpatient Encounter Medications as of 04/06/2020  Medication Sig  . clonazePAM  (KLONOPIN) 1 MG tablet Take 1 mg by mouth at bedtime.  . Multiple Vitamins-Minerals (ONE-A-DAY 50 PLUS PO) Take by mouth.  . zolpidem (AMBIEN) 10 MG tablet Take 1 tablet (10 mg total) by mouth at bedtime as needed for sleep.  . [DISCONTINUED] QUEtiapine (SEROQUEL) 25 MG tablet Take 1 tablet (25 mg total) by mouth at bedtime.   No facility-administered encounter medications on file as of 04/06/2020.   Allergies  Allergen Reactions  . Pollen Extract    Patient Active Problem List   Diagnosis Date Noted  . Finger pain, right 08/17/2016  . 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 Orchalgia 07/28/2015  . Status post vasectomy 07/28/2015  . Hx of adenomatous polyp of colon 04/28/2014  . Anal fissure 02/17/2014   Social History   Socioeconomic History  . Marital status: Single    Spouse name: Not on file  . Number of children: Not on file  . Years of education: Not on file  . Highest education level: Not on file  Occupational History  . Not on file  Tobacco Use  . Smoking status: Never Smoker  . Smokeless tobacco: Never Used  Vaping Use  . Vaping Use: Never used  Substance and Sexual Activity  . Alcohol use: Yes    Alcohol/week: 15.0 standard drinks    Types: 15 Cans of beer per week    Comment: only on the weekends  . Drug  use: No  . Sexual activity: Yes  Other Topics Concern  . Not on file  Social History Narrative  . Not on file   Social Determinants of Health   Financial Resource Strain:   . Difficulty of Paying Living Expenses:   Food Insecurity:   . Worried About Charity fundraiser in the Last Year:   . Arboriculturist in the Last Year:   Transportation Needs:   . Film/video editor (Medical):   Marland Kitchen Lack of Transportation (Non-Medical):   Physical Activity:   . Days of Exercise per Week:   . Minutes of Exercise per Session:   Stress:   . Feeling of Stress :   Social Connections:   . Frequency of Communication with Friends and  Family:   . Frequency of Social Gatherings with Friends and Family:   . Attends Religious Services:   . Active Member of Clubs or Organizations:   . Attends Archivist Meetings:   Marland Kitchen Marital Status:   Intimate Partner Violence:   . Fear of Current or Ex-Partner:   . Emotionally Abused:   Marland Kitchen Physically Abused:   . Sexually Abused:     Patrick Neal family history includes Colon cancer in an other family member; Prostate cancer in his father.      Objective:    Vitals:   04/06/20 1332  BP: 120/82    Physical Exam.Well-developed well-nourished Wmale in no acute distress.  Height, Weight,166 BMI 24.5  HEENT; nontraumatic normocephalic, EOMI, PER R LA, sclera anicteric. Oropharynx;not examined Neck; supple, no JVD Cardiovascular; regular rate and rhythm with S1-S2, no murmur rub or gallop Pulmonary; Clear bilaterally Abdomen; soft, nontender, nondistended, no palpable mass or hepatosplenomegaly, bowel sounds are active Rectal;not done Skin; benign exam, no jaundice rash or appreciable lesions Extremities; no clubbing cyanosis or edema skin warm and dry Neuro/Psych; alert and oriented x4, grossly nonfocal mood and affect appropriate       Assessment & Plan:   #52 54 year old white male with history of adenomatous colon polyp at colonoscopy 2015, recommended to have 5-year interval follow-up. #2 history of internal hemorrhoids and prior anal fissure.  Reports continued sporadic intermittent bright red blood on the tissue and occasionally on the stool.  Suspect secondary to local anal rectal source, rule out colitis, rule out neoplasm # 3.  Vague lower abdominal discomfort-described as an inflamed sensation.  More notable with twisting and with running.  Etiology not clear, abdominal exam is benign, suspect this may be musculoskeletal but cannot rule out other intra-abdominal inflammatory process.  Plan; Discussed CT of the abdomen and pelvis with patient, he would like to  hold off at present, due to cost.  We will proceed with colonoscopy with Patrick Neal.  Procedure was discussed in detail with the patient including indications risks and benefits and he is agreeable to proceed. Patient has been vaccinated against COVID-19. Patient was advised should he have any increase in his lower abdominal symptoms and colonoscopy is otherwise unrevealing that we should proceed with CT of the abdomen and pelvis.    Frankee Gritz S Jonathandavid Marlett PA-C 04/06/2020   Cc: Maryland Pink, MD

## 2020-04-13 ENCOUNTER — Other Ambulatory Visit: Payer: Self-pay

## 2020-04-13 ENCOUNTER — Encounter: Payer: Self-pay | Admitting: Internal Medicine

## 2020-04-13 ENCOUNTER — Ambulatory Visit (AMBULATORY_SURGERY_CENTER): Payer: BC Managed Care – PPO | Admitting: Internal Medicine

## 2020-04-13 VITALS — BP 109/72 | HR 45 | Temp 97.1°F | Resp 15 | Ht 69.0 in | Wt 166.0 lb

## 2020-04-13 DIAGNOSIS — K625 Hemorrhage of anus and rectum: Secondary | ICD-10-CM

## 2020-04-13 DIAGNOSIS — K648 Other hemorrhoids: Secondary | ICD-10-CM

## 2020-04-13 DIAGNOSIS — D122 Benign neoplasm of ascending colon: Secondary | ICD-10-CM | POA: Diagnosis not present

## 2020-04-13 DIAGNOSIS — Z1211 Encounter for screening for malignant neoplasm of colon: Secondary | ICD-10-CM | POA: Diagnosis not present

## 2020-04-13 DIAGNOSIS — Z86010 Personal history of colonic polyps: Secondary | ICD-10-CM

## 2020-04-13 MED ORDER — SODIUM CHLORIDE 0.9 % IV SOLN: 500.0000 mL | Freq: Once | INTRAVENOUS | Status: DC

## 2020-04-13 NOTE — Progress Notes (Signed)
Called to room to assist during endoscopic procedure.  Patient ID and intended procedure confirmed with present staff. Received instructions for my participation in the procedure from the performing physician.  

## 2020-04-13 NOTE — Progress Notes (Signed)
To PACU, VSS. Report to Rn.tb 

## 2020-04-13 NOTE — Patient Instructions (Addendum)
I found and removed one tiny polyp.  I saw some enlarged hemorrhoids that have been the source of bleeding.  If they are a persistent problem we can consider hemorrhoid banding.  I will let you know pathology results and when to have another routine colonoscopy by mail and/or My Chart.  I appreciate the opportunity to care for you. Gatha Mayer, MD, FACG   YOU HAD AN ENDOSCOPIC PROCEDURE TODAY AT [facility]:   Refer to the procedure report that was given to you for any specific questions about what was found during the examination.  If the procedure report does not answer your questions, please call your gastroenterologist to clarify.  If you requested that your care partner not be given the details of your procedure findings, then the procedure report has been included in a sealed envelope for you to review at your convenience later.  YOU SHOULD EXPECT: Some feelings of bloating in the abdomen. Passage of more gas than usual.  Walking can help get rid of the air that was put into your GI tract during the procedure and reduce the bloating. If you had a lower endoscopy (such as a colonoscopy or flexible sigmoidoscopy) you may notice spotting of blood in your stool or on the toilet paper. If you underwent a bowel prep for your procedure, you may not have a normal bowel movement for a few days.  Please Note:  You might notice some irritation and congestion in your nose or some drainage.  This is from the oxygen used during your procedure.  There is no need for concern and it should clear up in a day or so.  SYMPTOMS TO REPORT IMMEDIATELY:   Following lower endoscopy (colonoscopy or flexible sigmoidoscopy):  Excessive amounts of blood in the stool  Significant tenderness or worsening of abdominal pains  Swelling of the abdomen that is new, acute  Fever of 100F or higher   For urgent or emergent issues, a gastroenterologist can be reached at any hour by calling [phone]  [phone]. Do not use MyChart messaging for urgent concerns.    DIET:  We do recommend a small meal at first, but then you may proceed to your regular diet.  Drink plenty of fluids but you should avoid alcoholic beverages for 24 hours.  ACTIVITY:  You should plan to take it easy for the rest of today and you should NOT DRIVE or use heavy machinery until tomorrow (because of the sedation medicines used during the test).    FOLLOW UP: Our staff will call the number listed on your records 48-72 hours following your procedure to check on you and address any questions or concerns that you may have regarding the information given to you following your procedure. If we do not reach you, we will leave a message.  We will attempt to reach you two times.  During this call, we will ask if you have developed any symptoms of COVID 19. If you develop any symptoms (ie: fever, flu-like symptoms, shortness of breath, cough etc.) before then, please call [phone].  If you test positive for Covid 19 in the 2 weeks post procedure, please call and report this information to [place].    If any biopsies were taken you will be contacted by phone or by letter within the next 1-3 weeks.  Please call us at [phone] if you have not heard about the biopsies in 3 weeks.    SIGNATURES/CONFIDENTIALITY: You and/or your care partner have  signed paperwork which will be entered into your electronic medical record.  These signatures attest to the fact that that the information above on your After Visit Summary has been reviewed and is understood.  Full responsibility of the confidentiality of this discharge information lies with you and/or your care-partner.

## 2020-04-13 NOTE — Progress Notes (Signed)
Pt's states no medical or surgical changes since previsit or office visit. 

## 2020-04-13 NOTE — Op Note (Signed)
[facility] Patient Name: Patrick Neal Procedure Date: 04/13/2020 9:58 AM MRN: [id number] Endoscopist: Gatha Mayer , MD Age: 54 Referring MD:  Date of Birth: [birth year] Gender: Male Account #: [id number] Procedure:                Colonoscopy Indications:              Rectal bleeding Medicines:                Propofol per Anesthesia, Monitored Anesthesia Care Procedure:                Pre-Anesthesia Assessment:                           - Prior to the procedure, a History and Physical                            was performed, and patient medications and                            allergies were reviewed. The patient's tolerance of                            previous anesthesia was also reviewed. The risks                            and benefits of the procedure and the sedation                            options and risks were discussed with the patient.                            All questions were answered, and informed consent                            was obtained. Prior Anticoagulants: The patient has                            taken no previous anticoagulant or antiplatelet                            agents. ASA Grade Assessment: II - A patient with                            mild systemic disease. After reviewing the risks                            and benefits, the patient was deemed in                            satisfactory condition to undergo the procedure.                           After obtaining informed consent, the colonoscope  was passed under direct vision. Throughout the                            procedure, the patient's blood pressure, pulse, and                            oxygen saturations were monitored continuously. The                            Colonoscope was introduced through the anus and                            advanced to the the cecum, identified by                            appendiceal orifice and ileocecal  valve. The                            colonoscopy was performed without difficulty. The                            patient tolerated the procedure well. The quality                            of the bowel preparation was good. The bowel                            preparation used was Miralax via split dose                            instruction. The ileocecal valve, appendiceal                            orifice, and rectum were photographed. Scope In: 10:10:16 AM Scope Out: 10:27:38 AM Scope Withdrawal Time: 0 hours 13 minutes 22 seconds  Total Procedure Duration: 0 hours 17 minutes 22 seconds  Findings:                 The perianal and digital rectal examinations were                            normal. Pertinent negatives include normal prostate                            (size, shape, and consistency).                           A 1 mm polyp was found in the ascending colon. The                            polyp was sessile. The polyp was removed with a                            cold biopsy forceps. Resection and retrieval were  complete. Verification of patient identification                            for the specimen was done. Estimated blood loss was                            minimal.                           External and internal hemorrhoids were found.                           The exam was otherwise without abnormality on                            direct and retroflexion views. Complications:            No immediate complications. Estimated Blood Loss:     Estimated blood loss was minimal. Impression:               - One 1 mm polyp in the ascending colon, removed                            with a cold biopsy forceps. Resected and retrieved.                           - External and internal hemorrhoids. Cause of                            bleeding.                           - The examination was otherwise normal on direct                             and retroflexion views.                           - Personal history of colonic polyp - diminutive                            adenoma 2015. Recommendation:           - Patient has a contact number available for                            emergencies. The signs and symptoms of potential                            delayed complications were discussed with the                            patient. Return to normal activities tomorrow.                            Written discharge instructions were provided to the  patient.                           - Resume previous diet.                           - Continue present medications.                           - Repeat colonoscopy is recommended for                            surveillance. The colonoscopy date will be                            determined after pathology results from today's                            exam become available for review. Gatha Mayer, MD 04/13/2020 10:42:43 AM This report has been signed electronically.

## 2020-04-15 ENCOUNTER — Telehealth: Payer: Self-pay

## 2020-04-15 NOTE — Telephone Encounter (Signed)
  Follow up Call-  Call back number 04/13/2020  Post procedure Call Back phone  # [phone]  Permission to leave phone message Yes  Some recent data might be hidden     Patient questions:  Do you have a fever, pain , or abdominal swelling? No. Pain Score  0 *  Have you tolerated food without any problems? Yes.    Have you been able to return to your normal activities? Yes.    Do you have any questions about your discharge instructions: Diet   No. Medications  No. Follow up visit  No.  Do you have questions or concerns about your Care? No.  Actions: * If pain score is 4 or above: No action needed, pain <4.  1. Have you developed a fever since your procedure? no  2.   Have you had an respiratory symptoms (SOB or cough) since your procedure? no  3.   Have you tested positive for COVID 19 since your procedure no 4.   Have you had any family members/close contacts diagnosed with the COVID 19 since your procedure?  no   If yes to any of these questions please route to Joylene John, RN and Erenest Rasher, RN

## 2020-04-19 ENCOUNTER — Encounter: Payer: Self-pay | Admitting: Internal Medicine

## 2020-04-22 ENCOUNTER — Ambulatory Visit: Payer: 59 | Admitting: Physician Assistant

## 2020-06-28 ENCOUNTER — Encounter: Payer: 59 | Admitting: Dermatology

## 2020-08-04 DIAGNOSIS — Z8249 Family history of ischemic heart disease and other diseases of the circulatory system: Secondary | ICD-10-CM | POA: Diagnosis not present

## 2020-09-07 NOTE — Progress Notes (Signed)
Referring-James Hedrick MD Reason for referral-abnormal electrocardiogram and palpitations  HPI: 54 year old male for evaluation of abnormal electrocardiogram and palpitations at request of Jerl MinaJames Hedrick, MD. Patient states he has a long history of palpitations.  These have been intermittent and are described as a brief flutter.  They occur at any time but he does notice this occasionally after drinking fluid.  He recently had a monitor but I do not have those results available.  He did have symptoms with a monitor in place.  He otherwise is active and denies dyspnea on exertion, orthopnea, PND, pedal edema, exertional chest pain or syncope.  He has had occasions of dizziness with standing in the past.  Cardiology now asked to evaluate.  Current Outpatient Medications  Medication Sig Dispense Refill  . clonazePAM (KLONOPIN) 1 MG tablet Take 1 mg by mouth at bedtime.    . Multiple Vitamins-Minerals (ONE-A-DAY 50 PLUS PO) Take by mouth.    . zolpidem (AMBIEN) 10 MG tablet Take 1 tablet (10 mg total) by mouth at bedtime as needed for sleep. 30 tablet 1   No current facility-administered medications for this visit.    Allergies  Allergen Reactions  . Pollen Extract      Past Medical History:  Diagnosis Date  . Anxiety   . Depression   . Dysplastic nevus 03/24/2019   right upper back paraspinal, left upper back paraspinal, right UQA periumbilical 7.0 cm to umbilicus  . Hx of adenomatous polyp of colon 04/28/2014  . Hyperlipidemia     Past Surgical History:  Procedure Laterality Date  . COLONOSCOPY  04/2014  . SHOULDER ARTHROSCOPY    . VASECTOMY      Social History   Socioeconomic History  . Marital status: Divorced    Spouse name: Not on file  . Number of children: 1  . Years of education: Not on file  . Highest education level: Not on file  Occupational History  . Not on file  Tobacco Use  . Smoking status: Light Tobacco Smoker  . Smokeless tobacco: Never Used   Vaping Use  . Vaping Use: Never used  Substance and Sexual Activity  . Alcohol use: Yes    Alcohol/week: 15.0 standard drinks    Types: 15 Cans of beer per week    Comment: only on the weekends  . Drug use: No  . Sexual activity: Yes  Other Topics Concern  . Not on file  Social History Narrative  . Not on file   Social Determinants of Health   Financial Resource Strain: Not on file  Food Insecurity: Not on file  Transportation Needs: Not on file  Physical Activity: Not on file  Stress: Not on file  Social Connections: Not on file  Intimate Partner Violence: Not on file    Family History  Problem Relation Age of Onset  . Prostate cancer Father   . Atrial fibrillation Father   . Atrial fibrillation Brother   . Colon cancer Other     ROS: no fevers or chills, productive cough, hemoptysis, dysphasia, odynophagia, melena, hematochezia, dysuria, hematuria, rash, seizure activity, orthopnea, PND, pedal edema, claudication. Remaining systems are negative.  Physical Exam:   Blood pressure 108/68, pulse 76, height 5\' 9"  (1.753 m), weight 164 lb 1.9 oz (74.4 kg), SpO2 99 %.  General:  Well developed/well nourished in NAD Skin warm/dry Patient not depressed No peripheral clubbing Back-normal HEENT-normal/normal eyelids Neck supple/normal carotid upstroke bilaterally; no bruits; no JVD; no thyromegaly chest - CTA/  normal expansion CV - RRR/normal S1 and S2; no murmurs, rubs or gallops;  PMI nondisplaced Abdomen -NT/ND, no HSM, no mass, + bowel sounds, no bruit 2+ femoral pulses, no bruits Ext-no edema, chords, 2+ DP Neuro-grossly nonfocal  ECG - August 04, 2020-normal sinus rhythm at a rate of 60, cannot rule out prior inferior infarct. personally reviewed  A/P  1 abnormal ECG-outside electrocardiogram personally reviewed.  Sinus rhythm and prior inferior infarct cannot be excluded.  We will arrange an echocardiogram to assess wall motion.  If normal no plans for  further ischemia evaluation.  2 palpitations-patient has had longstanding palpitations described as a flutter.  He did have a recent monitor for 7 days and had symptoms with it in place.  We will obtain results from his primary care physician.  We can consider addition of a beta-blocker in the future if needed.  Arrange echocardiogram to assess LV function.  We'll also check TSH.  He is consuming significant amounts of beer.  I explained this may be contributing to worsening palpitations and he will decrease.  3 hyperlipidemia-follow-up primary care.  Olga Millers, MD

## 2020-09-15 ENCOUNTER — Ambulatory Visit: Payer: BC Managed Care – PPO | Admitting: Cardiology

## 2020-09-15 ENCOUNTER — Other Ambulatory Visit: Payer: Self-pay

## 2020-09-15 ENCOUNTER — Encounter: Payer: Self-pay | Admitting: Cardiology

## 2020-09-15 VITALS — BP 108/68 | HR 76 | Ht 69.0 in | Wt 164.1 lb

## 2020-09-15 DIAGNOSIS — R002 Palpitations: Secondary | ICD-10-CM | POA: Diagnosis not present

## 2020-09-15 DIAGNOSIS — R9431 Abnormal electrocardiogram [ECG] [EKG]: Secondary | ICD-10-CM

## 2020-09-15 DIAGNOSIS — E7800 Pure hypercholesterolemia, unspecified: Secondary | ICD-10-CM

## 2020-09-15 NOTE — Patient Instructions (Signed)
Medication Instructions:   NO CHANGE  *If you need a refill on your cardiac medications before your next appointment, please call your pharmacy*   Lab Work:  Your physician recommends that you HAVE LAB WORK TODAY  If you have labs (blood work) drawn today and your tests are completely normal, you will receive your results only by: Marland Kitchen MyChart Message (if you have MyChart) OR . A paper copy in the mail If you have any lab test that is abnormal or we need to change your treatment, we will call you to review the results.   Testing/Procedures:  Your physician has requested that you have an echocardiogram. Echocardiography is a painless test that uses sound waves to create images of your heart. It provides your doctor with information about the size and shape of your heart and how well your heart's chambers and valves are working. This procedure takes approximately one hour. There are no restrictions for this procedure.[facility] 1ST FLOOR IMAGING DEPARTMENT     Follow-Up: At [facility], you and your health needs are our priority.  As part of our continuing mission to provide you with exceptional heart care, we have created designated Provider Care Teams.  These Care Teams include your primary Cardiologist (physician) and Advanced Practice Providers (APPs -  Physician Assistants and Nurse Practitioners) who all work together to provide you with the care you need, when you need it.  We recommend signing up for the patient portal called "MyChart".  Sign up information is provided on this After Visit Summary.  MyChart is used to connect with patients for Virtual Visits (Telemedicine).  Patients are able to view lab/test results, encounter notes, upcoming appointments, etc.  Non-urgent messages can be sent to your provider as well.   To learn more about what you can do with MyChart, go to [email or web address].    Your next appointment:   3 month(s)  The format for your next  appointment:   In Person  Provider:   Olga Millers, MD

## 2020-09-16 LAB — TSH: TSH: 2.37 u[IU]/mL (ref 0.450–4.500)

## 2020-10-13 ENCOUNTER — Other Ambulatory Visit: Payer: Self-pay

## 2020-10-13 ENCOUNTER — Encounter (HOSPITAL_BASED_OUTPATIENT_CLINIC_OR_DEPARTMENT_OTHER): Payer: Self-pay

## 2020-10-13 ENCOUNTER — Ambulatory Visit (HOSPITAL_BASED_OUTPATIENT_CLINIC_OR_DEPARTMENT_OTHER)
Admission: RE | Admit: 2020-10-13 | Discharge: 2020-10-13 | Disposition: A | Discharge status: Home / Self Care | Payer: BC Managed Care – PPO | Source: Ambulatory Visit | Attending: Cardiology | Admitting: Cardiology

## 2020-10-13 ENCOUNTER — Other Ambulatory Visit (HOSPITAL_BASED_OUTPATIENT_CLINIC_OR_DEPARTMENT_OTHER): Payer: Self-pay | Admitting: Cardiology

## 2020-10-13 DIAGNOSIS — R002 Palpitations: Secondary | ICD-10-CM | POA: Diagnosis not present

## 2020-10-13 LAB — ECHOCARDIOGRAM COMPLETE
Area-P 1/2: 2.68 cm2
S' Lateral: 3.17 cm

## 2020-12-08 NOTE — Progress Notes (Signed)
HPI: FU abnormal electrocardiogram and palpitations. TSH January 2022 2.37.  Echocardiogram February 2022 showed normal LV function, grade 1 diastolic dysfunction. Since last seen his palpitations have improved.  He continues to have an occasional flutter which is longstanding but his symptoms are not sustained.  Otherwise denies dyspnea, chest pain or syncope.  Current Outpatient Medications  Medication Sig Dispense Refill  . clonazePAM (KLONOPIN) 1 MG tablet Take 1 mg by mouth at bedtime.    . Multiple Vitamins-Minerals (ONE-A-DAY 50 PLUS PO) Take by mouth.    . zolpidem (AMBIEN) 10 MG tablet Take 1 tablet (10 mg total) by mouth at bedtime as needed for sleep. 30 tablet 1   No current facility-administered medications for this visit.     Past Medical History:  Diagnosis Date  . Anxiety   . Depression   . Dysplastic nevus 03/24/2019   right upper back paraspinal, left upper back paraspinal, right UQA periumbilical 7.0 cm to umbilicus  . Hx of adenomatous polyp of colon 04/28/2014  . Hyperlipidemia     Past Surgical History:  Procedure Laterality Date  . COLONOSCOPY  04/2014  . SHOULDER ARTHROSCOPY    . VASECTOMY      Social History   Socioeconomic History  . Marital status: Divorced    Spouse name: Not on file  . Number of children: 1  . Years of education: Not on file  . Highest education level: Not on file  Occupational History  . Not on file  Tobacco Use  . Smoking status: Light Tobacco Smoker  . Smokeless tobacco: Never Used  Vaping Use  . Vaping Use: Never used  Substance and Sexual Activity  . Alcohol use: Yes    Alcohol/week: 15.0 standard drinks    Types: 15 Cans of beer per week    Comment: only on the weekends  . Drug use: No  . Sexual activity: Yes  Other Topics Concern  . Not on file  Social History Narrative  . Not on file   Social Determinants of Health   Financial Resource Strain: Not on file  Food Insecurity: Not on file   Transportation Needs: Not on file  Physical Activity: Not on file  Stress: Not on file  Social Connections: Not on file  Intimate Partner Violence: Not on file    Family History  Problem Relation Age of Onset  . Prostate cancer Father   . Atrial fibrillation Father   . Atrial fibrillation Brother   . Colon cancer Other     ROS: no fevers or chills, productive cough, hemoptysis, dysphasia, odynophagia, melena, hematochezia, dysuria, hematuria, rash, seizure activity, orthopnea, PND, pedal edema, claudication. Remaining systems are negative.  Physical Exam: Well-developed well-nourished in no acute distress.  Skin is warm and dry.  HEENT is normal.  Neck is supple.  Chest is clear to auscultation with normal expansion.  Cardiovascular exam is regular rate and rhythm.  Abdominal exam nontender or distended. No masses palpated. Extremities show no edema. neuro grossly intact  ECG-sinus bradycardia at a rate of 52, cannot rule out inferior infarct.  Personally reviewed  A/P  1 abnormal ECG-previous electrocardiogram showed sinus rhythm and prior inferior infarct cannot be excluded.  However echocardiogram shows normal wall motion.  No plans for further evaluation.  2 palpitations-patient has had longstanding palpitations.  We will consider addition of beta-blockade in the future if needed.  We will again try and obtain rhythm strips from previous monitor with primary care.  3 hyperlipidemia-Per primary care.  Kirk Ruths, MD

## 2020-12-15 ENCOUNTER — Ambulatory Visit: Payer: BC Managed Care – PPO | Admitting: Cardiology

## 2020-12-15 ENCOUNTER — Encounter: Payer: Self-pay | Admitting: Cardiology

## 2020-12-15 ENCOUNTER — Other Ambulatory Visit: Payer: Self-pay

## 2020-12-15 VITALS — BP 98/78 | HR 52 | Ht 69.0 in | Wt 162.0 lb

## 2020-12-15 DIAGNOSIS — R9431 Abnormal electrocardiogram [ECG] [EKG]: Secondary | ICD-10-CM | POA: Diagnosis not present

## 2020-12-15 DIAGNOSIS — R002 Palpitations: Secondary | ICD-10-CM | POA: Diagnosis not present

## 2020-12-15 NOTE — Patient Instructions (Signed)

## 2020-12-30 ENCOUNTER — Encounter: Payer: Self-pay | Admitting: *Deleted

## 2021-03-25 DIAGNOSIS — Z1389 Encounter for screening for other disorder: Secondary | ICD-10-CM | POA: Diagnosis not present

## 2021-03-25 DIAGNOSIS — Z13220 Encounter for screening for lipoid disorders: Secondary | ICD-10-CM | POA: Diagnosis not present

## 2021-03-25 DIAGNOSIS — Z125 Encounter for screening for malignant neoplasm of prostate: Secondary | ICD-10-CM | POA: Diagnosis not present

## 2021-03-25 DIAGNOSIS — Z0000 Encounter for general adult medical examination without abnormal findings: Secondary | ICD-10-CM | POA: Diagnosis not present

## 2021-03-31 ENCOUNTER — Other Ambulatory Visit: Payer: Self-pay

## 2021-03-31 ENCOUNTER — Ambulatory Visit: Payer: BC Managed Care – PPO | Admitting: Dermatology

## 2021-03-31 DIAGNOSIS — Z1283 Encounter for screening for malignant neoplasm of skin: Secondary | ICD-10-CM | POA: Diagnosis not present

## 2021-03-31 DIAGNOSIS — L814 Other melanin hyperpigmentation: Secondary | ICD-10-CM

## 2021-03-31 DIAGNOSIS — Z0000 Encounter for general adult medical examination without abnormal findings: Secondary | ICD-10-CM | POA: Diagnosis not present

## 2021-03-31 DIAGNOSIS — D2372 Other benign neoplasm of skin of left lower limb, including hip: Secondary | ICD-10-CM

## 2021-03-31 DIAGNOSIS — L820 Inflamed seborrheic keratosis: Secondary | ICD-10-CM

## 2021-03-31 DIAGNOSIS — F5104 Psychophysiologic insomnia: Secondary | ICD-10-CM | POA: Diagnosis not present

## 2021-03-31 DIAGNOSIS — D239 Other benign neoplasm of skin, unspecified: Secondary | ICD-10-CM

## 2021-03-31 DIAGNOSIS — F419 Anxiety disorder, unspecified: Secondary | ICD-10-CM | POA: Diagnosis not present

## 2021-03-31 DIAGNOSIS — E785 Hyperlipidemia, unspecified: Secondary | ICD-10-CM | POA: Diagnosis not present

## 2021-03-31 DIAGNOSIS — L570 Actinic keratosis: Secondary | ICD-10-CM

## 2021-03-31 DIAGNOSIS — L219 Seborrheic dermatitis, unspecified: Secondary | ICD-10-CM | POA: Diagnosis not present

## 2021-03-31 DIAGNOSIS — L578 Other skin changes due to chronic exposure to nonionizing radiation: Secondary | ICD-10-CM

## 2021-03-31 DIAGNOSIS — D229 Melanocytic nevi, unspecified: Secondary | ICD-10-CM

## 2021-03-31 DIAGNOSIS — D1800 Hemangioma unspecified site: Secondary | ICD-10-CM

## 2021-03-31 DIAGNOSIS — L821 Other seborrheic keratosis: Secondary | ICD-10-CM

## 2021-03-31 DIAGNOSIS — Z86018 Personal history of other benign neoplasm: Secondary | ICD-10-CM | POA: Diagnosis not present

## 2021-03-31 MED ORDER — HYDROCORTISONE 2.5 % EX LOTN: TOPICAL_LOTION | CUTANEOUS | 3 refills | Status: DC

## 2021-03-31 MED ORDER — KETOCONAZOLE 2 % EX CREA: TOPICAL_CREAM | CUTANEOUS | 3 refills | Status: DC

## 2021-03-31 NOTE — Patient Instructions (Signed)

## 2021-03-31 NOTE — Progress Notes (Signed)
New Patient Visit  Subjective  Patrick Neal is a 55 y.o. male who presents for the following: Annual Exam (Hx dysplastic nevi ). The patient presents for Total-Body Skin Exam (TBSE) for skin cancer screening and mole check.  The following portions of the chart were reviewed this encounter and updated as appropriate:   Tobacco  Allergies  Meds  Problems  Med Hx  Surg Hx  Fam Hx     Review of Systems:  No other skin or systemic complaints except as noted in HPI or Assessment and Plan.  Objective  Well appearing patient in no apparent distress; mood and affect are within normal limits.  A full examination was performed including scalp, head, eyes, ears, nose, lips, neck, chest, axillae, abdomen, back, buttocks, bilateral upper extremities, bilateral lower extremities, hands, feet, fingers, toes, fingernails, and toenails. All findings within normal limits unless otherwise noted below.   Assessment & Plan  AK (actinic keratosis) R ear x 1  Destruction of lesion - R ear x 1 Complexity: simple   Destruction method: cryotherapy   Informed consent: discussed and consent obtained   Timeout:  patient name, date of birth, surgical site, and procedure verified Lesion destroyed using liquid nitrogen: Yes   Region frozen until ice ball extended beyond lesion: Yes   Outcome: patient tolerated procedure well with no complications   Post-procedure details: wound care instructions given    Seborrheic dermatitis Face  Seborrheic Dermatitis  -  is a chronic persistent rash characterized by pinkness and scaling most commonly of the mid face but also can occur on the scalp (dandruff), ears; mid chest and mid back. It tends to be exacerbated by stress and cooler weather.  People who have neurologic disease may experience new onset or exacerbation of existing seborrheic dermatitis.  The condition is not curable but treatable and can be controlled.  Start HC 2.5% lotion QHS on M, W, F  alternating with Ketoconazole 2% cream Tues, Th, Sat QHS.  hydrocortisone 2.5 % lotion - Face Apply to aa's QHS on Monday, Wednesday, and Friday.  ketoconazole (NIZORAL) 2 % cream - Face Apply to aa's QHS on Tuesday, Thursday, and Saturday.  Dermatofibroma L prox med pretibial x 1  Benign growth possibly related to trauma, such as an insect bite.  Discussed removal (shave vrs excision), resulting scar and risk of recurrence.  Since not bothersome, will observe for now.  Inflamed seborrheic keratosis R calf x 1  Destruction of lesion - R calf x 1 Complexity: simple   Destruction method: cryotherapy   Informed consent: discussed and consent obtained   Timeout:  patient name, date of birth, surgical site, and procedure verified Lesion destroyed using liquid nitrogen: Yes   Region frozen until ice ball extended beyond lesion: Yes   Outcome: patient tolerated procedure well with no complications   Post-procedure details: wound care instructions given    Skin cancer screening  Lentigines - Scattered tan macules - Due to sun exposure - Benign-appering, observe - Recommend daily broad spectrum sunscreen SPF 30+ to sun-exposed areas, reapply every 2 hours as needed. - Call for any changes  Seborrheic Keratoses - Stuck-on, waxy, tan-brown papules and/or plaques  - Benign-appearing - Discussed benign etiology and prognosis. - Observe - Call for any changes  Melanocytic Nevi - Tan-brown and/or pink-flesh-colored symmetric macules and papules - Benign appearing on exam today - Observation - Call clinic for new or changing moles - Recommend daily use of broad spectrum spf 30+ sunscreen  to sun-exposed areas.   Hemangiomas - Red papules - Discussed benign nature - Observe - Call for any changes  Actinic Damage - Chronic condition, secondary to cumulative UV/sun exposure - diffuse scaly erythematous macules with underlying dyspigmentation - Recommend daily broad spectrum  sunscreen SPF 30+ to sun-exposed areas, reapply every 2 hours as needed.  - Staying in the shade or wearing long sleeves, sun glasses (UVA+UVB protection) and wide brim hats (4-inch brim around the entire circumference of the hat) are also recommended for sun protection.  - Call for new or changing lesions.  History of Dysplastic Nevi - No evidence of recurrence today - Recommend regular full body skin exams - Recommend daily broad spectrum sunscreen SPF 30+ to sun-exposed areas, reapply every 2 hours as needed.  - Call if any new or changing lesions are noted between office visits  Skin cancer screening performed today.  Return in about 1 year (around 03/31/2022) for TBSE - hx dysplastic nevi .  Luther Redo, CMA, am acting as scribe for Sarina Ser, MD .  Documentation: I have reviewed the above documentation for accuracy and completeness, and I agree with the above.  Sarina Ser, MD

## 2021-04-02 ENCOUNTER — Encounter: Payer: Self-pay | Admitting: Dermatology

## 2022-04-05 ENCOUNTER — Encounter: Payer: BC Managed Care – PPO | Admitting: Dermatology

## 2022-07-16 NOTE — Progress Notes (Signed)
Sleep Medicine   Office Visit  Patient Name: Patrick Neal DOB: 1965-12-11 MRN [id number]    Chief Complaint: insomnia   Brief History:  Patrick Neal presents for an initial sleep evaluation.  Patient states his sleep quality is very poor. This is noted most nights. The patient's bed partner reports  occasional snoring at night. The patient relates the following symptoms: EDS, insomnia, difficulty getting to sleep and staying asleep are also present. The patient has had issues with insomnia for about 20 years, he can only sleep with Ambien or  Klonopin. Pt states he takes more than prescribed- he reports using 5 or 6 tablets of either the clonazepam or the zolpidem nightly.  Patient states his primary doctor Dr. Kary Kos  is aware of this use, as is a psychologist he consulted recently.  The patient goes to sleep at 9:30pm and wakes up at 5:45am-6:30pm.   Sleep quality is worse when outside home environment.  Patient has noted no restlessness of his legs at night.  He does report tossing and turning however. The patient  relates no unusual behavior during the night.  The patient reports a history of psychiatric problems (anxiety, depression). The Epworth Sleepiness Score is 0 out of 24 .  The patient relates  Cardiovascular risk factors include: none.     ROS  General: (-) fever, (-) chills, (-) night sweat Nose and Sinuses: (-) nasal stuffiness or itchiness, (-) postnasal drip, (-) nosebleeds, (-) sinus trouble. Mouth and Throat: (-) sore throat, (-) hoarseness. Neck: (-) swollen glands, (-) enlarged thyroid, (-) neck pain. Respiratory: - cough, - shortness of breath, - wheezing. Neurologic: - numbness, - tingling. Psychiatric: + anxiety, + depression Sleep behavior: -sleep paralysis -hypnogogic hallucinations -dream enactment      -vivid dreams -cataplexy -night terrors -sleep walking   Current Medication: Outpatient Encounter Medications as of 07/17/2022  Medication Sig   CAPLYTA  42 MG capsule Take 42 mg by mouth daily.   clonazePAM (KLONOPIN) 1 MG tablet Take 1 tablet by mouth 2 (two) times daily.   hydrocortisone 2.5 % lotion Apply to aa's QHS on Monday, Wednesday, and Friday.   ketoconazole (NIZORAL) 2 % cream Apply to aa's QHS on Tuesday, Thursday, and Saturday.   Multiple Vitamins-Minerals (ONE-A-DAY 50 PLUS PO) Take by mouth.   zolpidem (AMBIEN) 10 MG tablet Take 1 tablet (10 mg total) by mouth at bedtime as needed for sleep.   [DISCONTINUED] clonazePAM (KLONOPIN) 1 MG tablet Take 1 mg by mouth at bedtime.   No facility-administered encounter medications on file as of 07/17/2022.    Surgical History: Past Surgical History:  Procedure Laterality Date   COLONOSCOPY  04/2014   SHOULDER ARTHROSCOPY     VASECTOMY      Medical History: Past Medical History:  Diagnosis Date   Anxiety    Depression    Dysplastic nevus 03/24/2019   right upper back paraspinal, left upper back paraspinal, right UQA periumbilical 7.0 cm to umbilicus   Hx of adenomatous polyp of colon 04/28/2014   Hyperlipidemia     Family History: Non contributory to the present illness  Social History: Social History   Socioeconomic History   Marital status: Divorced    Spouse name: Not on file   Number of children: 1   Years of education: Not on file   Highest education level: Not on file  Occupational History   Not on file  Tobacco Use   Smoking status: Former    Types: Cigarettes  Smokeless tobacco: Never  Vaping Use   Vaping Use: Never used  Substance and Sexual Activity   Alcohol use: Yes    Alcohol/week: 15.0 standard drinks of alcohol    Types: 15 Cans of beer per week    Comment: only on the weekends   Drug use: No   Sexual activity: Yes  Other Topics Concern   Not on file  Social History Narrative   Not on file   Social Determinants of Health   Financial Resource Strain: Not on file  Food Insecurity: Not on file  Transportation Needs: Not on file   Physical Activity: Not on file  Stress: Not on file  Social Connections: Not on file  Intimate Partner Violence: Not on file    Vital Signs: Blood pressure 134/84, pulse 61, resp. rate 16, height '5\' 9"'$  (1.753 m), weight 175 lb (79.4 kg), SpO2 99 %. Body mass index is 25.84 kg/m.   Examination: General Appearance: The patient is well-developed, well-nourished, and in no distress. Neck Circumference: 37 cm Skin: Gross inspection of skin unremarkable. Head: normocephalic, no gross deformities. Eyes: no gross deformities noted. ENT: ears appear grossly normal Neurologic: Alert and oriented. No involuntary movements.    STOP BANG RISK ASSESSMENT S (snore) Have you been told that you snore?     YES  T (tired) Are you often tired, fatigued, or sleepy during the day?   YES  O (obstruction) Do you stop breathing, choke, or gasp during sleep? NO   P (pressure) Do you have or are you being treated for high blood pressure? NO   B (BMI) Is your body index greater than 35 kg/m? NO   A (age) Are you 54 years old or older? YES   N (neck) Do you have a neck circumference greater than 16 inches?   NO   G (gender) Are you a male? YES   TOTAL STOP/BANG "YES" ANSWERS 4                                                               A STOP-Bang score of 2 or less is considered low risk, and a score of 5 or more is high risk for having either moderate or severe OSA. For people who score 3 or 4, doctors may need to perform further assessment to determine how likely they are to have OSA.         EPWORTH SLEEPINESS SCALE:  Scale:  (0)= no chance of dozing; (1)= slight chance of dozing; (2)= moderate chance of dozing; (3)= high chance of dozing  Chance  Situtation    Sitting and reading: 0    Watching TV: 0    Sitting Inactive in public: 0    As a passenger in car: 0      Lying down to rest: 0    Sitting and talking: 0    Sitting quielty after lunch: 0    In a car, stopped in  traffic: 0   TOTAL SCORE:   0 out of 24    SLEEP STUDIES:  None on file - patient hasn't had a sleep study   LABS: No results found for this or any previous visit (from the past 2160 hour(s)).  Radiology: ECHOCARDIOGRAM COMPLETE  Result Date: 10/13/2020  ECHOCARDIOGRAM REPORT   Patient Name:   MUNEER Neal Date of Exam: 10/13/2020 Medical Rec #:  [id number]            Height:       69.0 in Accession #:    [id number]           Weight:       164.1 lb Date of Birth:  04/20/1966            BSA:          1.899 m Patient Age:    69 years             BP:           108/68 mmHg Patient Gender: M                    HR:           63 bpm. Exam Location:  [facility] Procedure: 2D Echo, 3D Echo, Cardiac Doppler and Color Doppler Indications:    R00.2 Palpitations  History:        Patient has no prior history of Echocardiogram examinations.                 Abnormal ECG, Signs/Symptoms:Palpitations; Risk                 Factors:Dyslipidemia, Current Smoker and Alcohol.  Sonographer:    Geradine Girt Referring Phys: [place]  1. Left ventricular ejection fraction, by estimation, is 60 to 65%. The left ventricle has normal function. The left ventricle has no regional wall motion abnormalities. Left ventricular diastolic parameters are consistent with Grade I diastolic dysfunction (impaired relaxation).  2. Right ventricular systolic function is normal. The right ventricular size is normal.  3. The mitral valve is normal in structure. No evidence of mitral valve regurgitation. No evidence of mitral stenosis.  4. The aortic valve is tricuspid. Aortic valve regurgitation is not visualized. No aortic stenosis is present.  5. The inferior vena cava is normal in size with greater than 50% respiratory variability, suggesting right atrial pressure of 3 mmHg. FINDINGS  Left Ventricle: Left ventricular ejection fraction, by estimation, is 60 to 65%. The left ventricle has normal function. The left  ventricle has no regional wall motion abnormalities. The left ventricular internal cavity size was normal in size. There is  no left ventricular hypertrophy. Left ventricular diastolic parameters are consistent with Grade I diastolic dysfunction (impaired relaxation). Normal left ventricular filling pressure. Right Ventricle: The right ventricular size is normal. No increase in right ventricular wall thickness. Right ventricular systolic function is normal. Left Atrium: Left atrial size was normal in size. Right Atrium: Right atrial size was normal in size. Pericardium: There is no evidence of pericardial effusion. Mitral Valve: The mitral valve is normal in structure. No evidence of mitral valve regurgitation. No evidence of mitral valve stenosis. Tricuspid Valve: The tricuspid valve is normal in structure. Tricuspid valve regurgitation is trivial. No evidence of tricuspid stenosis. Aortic Valve: The aortic valve is tricuspid. Aortic valve regurgitation is not visualized. No aortic stenosis is present. Pulmonic Valve: The pulmonic valve was normal in structure. Pulmonic valve regurgitation is not visualized. No evidence of pulmonic stenosis. Aorta: The aortic root, ascending aorta and aortic arch are all structurally normal, with no evidence of dilitation or obstruction. Venous: A normal flow pattern is recorded from the right upper pulmonary vein. The inferior vena cava is normal in size with  greater than 50% respiratory variability, suggesting right atrial pressure of 3 mmHg. IAS/Shunts: No atrial level shunt detected by color flow Doppler.  LEFT VENTRICLE PLAX 2D LVIDd:         4.57 cm  Diastology LVIDs:         3.17 cm  LV e' medial:    8.05 cm/s LV PW:         0.78 cm  LV E/e' medial:  8.1 LV IVS:        0.77 cm  LV e' lateral:   8.92 cm/s LVOT diam:     1.80 cm  LV E/e' lateral: 7.3 LV SV:         61 LV SV Index:   32 LVOT Area:     2.54 cm                          3D Volume EF:                         3D  EF:        54 %                         LV EDV:       122 ml                         LV ESV:       56 ml                         LV SV:        66 ml RIGHT VENTRICLE RV S prime:     12.00 cm/s TAPSE (M-mode): 1.9 cm LEFT ATRIUM             Index       RIGHT ATRIUM           Index LA diam:        2.90 cm 1.53 cm/m  RA Area:     10.50 cm LA Vol (A2C):   28.9 ml 15.21 ml/m RA Volume:   21.70 ml  11.42 ml/m LA Vol (A4C):   17.5 ml 9.21 ml/m LA Biplane Vol: 23.2 ml 12.21 ml/m  AORTIC VALVE LVOT Vmax:   124.00 cm/s LVOT Vmean:  74.100 cm/s LVOT VTI:    0.240 m  AORTA Ao Root diam: 2.80 cm Ao Asc diam:  2.60 cm MITRAL VALVE MV Area (PHT): 2.68 cm    SHUNTS MV Decel Time: 283 msec    Systemic VTI:  0.24 m MV E velocity: 65.10 cm/s  Systemic Diam: 1.80 cm MV A velocity: 51.00 cm/s MV E/A ratio:  1.28 Shirlee More MD Electronically signed by Shirlee More MD Signature Date/Time: 10/13/2020/12:28:38 PM    Final     No results found.  No results found.    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 Insomnia, unspecified type PLAN insomnia:  Patient evaluation suggests chronic  insomnia. This is a chronic  disorder. This has been a concern for over 10 years and causes impaired daytime functioning. The patient exhibits comorbid anxiety and depression.   The history does suggest the insomnia predates the use of hypnotic medications. The symptoms worsened with the discontinuation of these medications in the past, although he relates some years of having taken trazodone with positive effect.  I have advised he discuss with his doctor and psychiatrist that he is using 5  to 6 pills of either klonopin 1 mg  or ambien 10 mg (totalling 50-60 mg in a 24 hour period) daily. He assures me that they are aware. I have explained that these dosages are far beyond recommended doses and carry high risks. I advised the patient to schedule an appointment with his primary physician to discuss reducing or discontinuing these medications under close medical supervision, which may require an inpatient setting for safety. The patient also reports heavy alcohol consumption-  5 or more beers for three nights of the week typically.  He is aware of the risks of combining alcohol with sedatives. I have also advised him that his alcohol use is likely contributing to his insomnia.  We have given him sleep hygiene information. Once the medication is safely discontinued, he may be a candidate for a CBTI program, which are available from online sources or licensed psychologists.   2. OSA (obstructive sleep apnea) PLAN OSA:   Patient evaluation suggests high risk of sleep disordered breathing due to frequent awakenings and insomnia.  Suggest: PSG  to assess  the patient's sleep disordered breathing. The patient was also counselled on sleep hygiene  to optimize sleep health.    3. Frequent nocturnal awakening Will do PSG to determine cause. He does report some tossing and turning if he does not take sedatives.     General Counseling: I have discussed the findings of the evaluation and examination with Belenda Cruise.  I have also discussed any further diagnostic evaluation thatmay be needed or ordered today. Hearl verbalizes understanding of the findings of todays visit. We also reviewed his medications today and discussed drug interactions and side effects including but not limited excessive drowsiness and altered mental states. We also discussed that there is always a risk not just to him but also people around him. he has been encouraged to call the office with any questions or concerns that should  arise related to todays visit.  No orders of the defined types were placed in this encounter.       I have personally obtained a history, evaluated the patient, evaluated pertinent data, formulated the assessment and plan and placed orders.   This patient was seen today by Tressie Ellis, PA-C in collaboration with Dr. Devona Konig.   Allyne Gee, MD [facility] Diplomate ABMS Pulmonary and Critical Care Medicine Sleep medicine

## 2022-07-17 ENCOUNTER — Ambulatory Visit (INDEPENDENT_AMBULATORY_CARE_PROVIDER_SITE_OTHER): Payer: BC Managed Care – PPO | Admitting: Internal Medicine

## 2022-07-17 VITALS — BP 134/84 | HR 61 | Resp 16 | Ht 69.0 in | Wt 175.0 lb

## 2022-07-17 DIAGNOSIS — G4733 Obstructive sleep apnea (adult) (pediatric): Secondary | ICD-10-CM | POA: Diagnosis not present

## 2022-07-17 DIAGNOSIS — G4700 Insomnia, unspecified: Secondary | ICD-10-CM

## 2022-07-17 NOTE — Patient Instructions (Signed)
Insomnia Insomnia is a sleep disorder that makes it difficult to fall asleep or stay asleep. Insomnia can cause fatigue, low energy, difficulty concentrating, mood swings, and poor performance at work or school. There are three different ways to classify insomnia: Difficulty falling asleep. Difficulty staying asleep. Waking up too early in the morning. Any type of insomnia can be long-term (chronic) or short-term (acute). Both are common. Short-term insomnia usually lasts for 3 months or less. Chronic insomnia occurs at least three times a week for longer than 3 months. What are the causes? Insomnia may be caused by another condition, situation, or substance, such as: Having certain mental health conditions, such as anxiety and depression. Using caffeine, alcohol, tobacco, or drugs. Having gastrointestinal conditions, such as gastroesophageal reflux disease (GERD). Having certain medical conditions. These include: Asthma. Alzheimer's disease. Stroke. Chronic pain. An overactive thyroid gland (hyperthyroidism). Other sleep disorders, such as restless legs syndrome and sleep apnea. Menopause. Sometimes, the cause of insomnia may not be known. What increases the risk? Risk factors for insomnia include: Gender. Females are affected more often than males. Age. Insomnia is more common as people get older. Stress and certain medical and mental health conditions. Lack of exercise. Having an irregular work schedule. This may include working night shifts and traveling between different time zones. What are the signs or symptoms? If you have insomnia, the main symptom is having trouble falling asleep or having trouble staying asleep. This may lead to other symptoms, such as: Feeling tired or having low energy. Feeling nervous about going to sleep. Not feeling rested in the morning. Having trouble concentrating. Feeling irritable, anxious, or depressed. How is this diagnosed? This condition  may be diagnosed based on: Your symptoms and medical history. Your health care provider may ask about: Your sleep habits. Any medical conditions you have. Your mental health. A physical exam. How is this treated? Treatment for insomnia depends on the cause. Treatment may focus on treating an underlying condition that is causing the insomnia. Treatment may also include: Medicines to help you sleep. Counseling or therapy. Lifestyle adjustments to help you sleep better. Follow these instructions at home: Eating and drinking  Limit or avoid alcohol, caffeinated beverages, and products that contain nicotine and tobacco, especially close to bedtime. These can disrupt your sleep. Do not eat a large meal or eat spicy foods right before bedtime. This can lead to digestive discomfort that can make it hard for you to sleep. Sleep habits  Keep a sleep diary to help you and your health care provider figure out what could be causing your insomnia. Write down: When you sleep. When you wake up during the night. How well you sleep and how rested you feel the next day. Any side effects of medicines you are taking. What you eat and drink. Make your bedroom a dark, comfortable place where it is easy to fall asleep. Put up shades or blackout curtains to block light from outside. Use a white noise machine to block noise. Keep the temperature cool. Limit screen use before bedtime. This includes: Not watching TV. Not using your smartphone, tablet, or computer. Stick to a routine that includes going to bed and waking up at the same times every day and night. This can help you fall asleep faster. Consider making a quiet activity, such as reading, part of your nighttime routine. Try to avoid taking naps during the day so that you sleep better at night. Get out of bed if you are still awake after   15 minutes of trying to sleep. Keep the lights down, but try reading or doing a quiet activity. When you feel  sleepy, go back to bed. General instructions Take over-the-counter and prescription medicines only as told by your health care provider. Exercise regularly as told by your health care provider. However, avoid exercising in the hours right before bedtime. Use relaxation techniques to manage stress. Ask your health care provider to suggest some techniques that may work well for you. These may include: Breathing exercises. Routines to release muscle tension. Visualizing peaceful scenes. Make sure that you drive carefully. Do not drive if you feel very sleepy. Keep all follow-up visits. This is important. Contact a health care provider if: You are tired throughout the day. You have trouble in your daily routine due to sleepiness. You continue to have sleep problems, or your sleep problems get worse. Get help right away if: You have thoughts about hurting yourself or someone else. Get help right away if you feel like you may hurt yourself or others, or have thoughts about taking your own life. Go to your nearest emergency room or: Call 911. Call the National Suicide Prevention Lifeline at 1-800-273-8255 or 988. This is open 24 hours a day. Text the Crisis Text Line at 741741. Summary Insomnia is a sleep disorder that makes it difficult to fall asleep or stay asleep. Insomnia can be long-term (chronic) or short-term (acute). Treatment for insomnia depends on the cause. Treatment may focus on treating an underlying condition that is causing the insomnia. Keep a sleep diary to help you and your health care provider figure out what could be causing your insomnia. This information is not intended to replace advice given to you by your health care provider. Make sure you discuss any questions you have with your health care provider. Document Revised: 08/08/2021 Document Reviewed: 08/08/2021 Elsevier Patient Education  2023 [employer]  

## 2023-02-22 ENCOUNTER — Encounter: Payer: BC Managed Care – PPO | Admitting: Dermatology

## 2023-05-09 ENCOUNTER — Encounter: Payer: Self-pay | Admitting: Dermatology

## 2023-05-09 ENCOUNTER — Ambulatory Visit: Payer: BC Managed Care – PPO | Admitting: Dermatology

## 2023-05-09 VITALS — BP 91/63 | HR 57

## 2023-05-09 DIAGNOSIS — L578 Other skin changes due to chronic exposure to nonionizing radiation: Secondary | ICD-10-CM

## 2023-05-09 DIAGNOSIS — Z1283 Encounter for screening for malignant neoplasm of skin: Secondary | ICD-10-CM | POA: Diagnosis not present

## 2023-05-09 DIAGNOSIS — L814 Other melanin hyperpigmentation: Secondary | ICD-10-CM

## 2023-05-09 DIAGNOSIS — Z7189 Other specified counseling: Secondary | ICD-10-CM

## 2023-05-09 DIAGNOSIS — L219 Seborrheic dermatitis, unspecified: Secondary | ICD-10-CM | POA: Diagnosis not present

## 2023-05-09 DIAGNOSIS — Z79899 Other long term (current) drug therapy: Secondary | ICD-10-CM

## 2023-05-09 DIAGNOSIS — D229 Melanocytic nevi, unspecified: Secondary | ICD-10-CM

## 2023-05-09 DIAGNOSIS — Z86018 Personal history of other benign neoplasm: Secondary | ICD-10-CM

## 2023-05-09 MED ORDER — KETOCONAZOLE 2 % EX CREA: TOPICAL_CREAM | CUTANEOUS | 11 refills | Status: DC

## 2023-05-09 MED ORDER — HYDROCORTISONE 2.5 % EX LOTN: TOPICAL_LOTION | CUTANEOUS | 11 refills | Status: DC

## 2023-05-09 NOTE — Patient Instructions (Signed)

## 2023-05-09 NOTE — Progress Notes (Signed)
Follow-Up Visit   Subjective  Patrick Neal is a 56 y.o. male who presents for the following: Skin Cancer Screening and Full Body Skin Exam hx of seb derm , hx of aks, hx of dysplastic  Reports some redness at face after shaving and dryness at nose.   The patient presents for Total-Body Skin Exam (TBSE) for skin cancer screening and mole check. The patient has spots, moles and lesions to be evaluated, some may be new or changing and the patient may have concern these could be cancer.    The following portions of the chart were reviewed this encounter and updated as appropriate: medications, allergies, medical history  Review of Systems:  No other skin or systemic complaints except as noted in HPI or Assessment and Plan.  Objective  Well appearing patient in no apparent distress; mood and affect are within normal limits.  A full examination was performed including scalp, head, eyes, ears, nose, lips, neck, chest, axillae, abdomen, back, buttocks, bilateral upper extremities, bilateral lower extremities, hands, feet, fingers, toes, fingernails, and toenails. All findings within normal limits unless otherwise noted below.   Relevant physical exam findings are noted in the Assessment and Plan.    Assessment & Plan   SKIN CANCER SCREENING PERFORMED TODAY.  ACTINIC DAMAGE - Chronic condition, secondary to cumulative UV/sun exposure - diffuse scaly erythematous macules with underlying dyspigmentation - Recommend daily broad spectrum sunscreen SPF 30+ to sun-exposed areas, reapply every 2 hours as needed.  - Staying in the shade or wearing long sleeves, sun glasses (UVA+UVB protection) and wide brim hats (4-inch brim around the entire circumference of the hat) are also recommended for sun protection.  - Call for new or changing lesions.  LENTIGINES, SEBORRHEIC KERATOSES, HEMANGIOMAS - Benign normal skin lesions - Benign-appearing - Call for any changes  MELANOCYTIC NEVI -  Tan-brown and/or pink-flesh-colored symmetric macules and papules - Benign appearing on exam today - Observation - Call clinic for new or changing moles - Recommend daily use of broad spectrum spf 30+ sunscreen to sun-exposed areas.    SEBORRHEIC DERMATITIS at face and nose Exam: Pink patches with greasy scale at face and nose  Chronic and persistent condition with duration or expected duration over one year. Condition is symptomatic/ bothersome to patient. Not currently at goal.   Seborrheic Dermatitis is a chronic persistent rash characterized by pinkness and scaling most commonly of the mid face but also can occur on the scalp (dandruff), ears; mid chest, mid back and groin.  It tends to be exacerbated by stress and cooler weather.  People who have neurologic disease may experience new onset or exacerbation of existing seborrheic dermatitis.  The condition is not curable but treatable and can be controlled.  Treatment Plan: Continue  HC 2.5% lotion QHS on M, W, F alternating with Continue Ketoconazole 2% cream Tues, Th, Sat QHS.  Seborrheic dermatitis  Related Medications hydrocortisone 2.5 % lotion Apply to aa's QHS on Monday, Wednesday, and Friday.  ketoconazole (NIZORAL) 2 % cream Apply to aa's QHS on Tuesday, Thursday, and Saturday.   Dermatofibroma L prox med pretibial x 1   Benign growth possibly related to trauma, such as an insect bite.  Discussed removal (shave vrs excision), resulting scar and risk of recurrence.   Since not bothersome, will observe for now.   HISTORY OF DYSPLASTIC NEVUS 03/2019 right upper back paraspinal moderate atypia, left upper back paraspinal moderate atypia, right UQA periumbilical 7.0 cm to umbilicus with mild atypia  No evidence of recurrence today Recommend regular full body skin exams Recommend daily broad spectrum sunscreen SPF 30+ to sun-exposed areas, reapply every 2 hours as needed.  Call if any new or changing lesions are noted  between office visits  Return in about 1 year (around 05/08/2024) for TBSE.  IAsher Muir, CMA, am acting as scribe for Armida Sans, MD.   Documentation: I have reviewed the above documentation for accuracy and completeness, and I agree with the above.  Armida Sans, MD

## 2023-05-11 ENCOUNTER — Encounter: Payer: Self-pay | Admitting: Dermatology

## 2024-05-28 ENCOUNTER — Ambulatory Visit: Payer: BC Managed Care – PPO | Admitting: Dermatology

## 2024-05-28 ENCOUNTER — Encounter: Payer: Self-pay | Admitting: Dermatology

## 2024-05-28 DIAGNOSIS — D2372 Other benign neoplasm of skin of left lower limb, including hip: Secondary | ICD-10-CM

## 2024-05-28 DIAGNOSIS — L219 Seborrheic dermatitis, unspecified: Secondary | ICD-10-CM

## 2024-05-28 DIAGNOSIS — Z7189 Other specified counseling: Secondary | ICD-10-CM

## 2024-05-28 DIAGNOSIS — L570 Actinic keratosis: Secondary | ICD-10-CM | POA: Diagnosis not present

## 2024-05-28 DIAGNOSIS — L821 Other seborrheic keratosis: Secondary | ICD-10-CM

## 2024-05-28 DIAGNOSIS — L814 Other melanin hyperpigmentation: Secondary | ICD-10-CM | POA: Diagnosis not present

## 2024-05-28 DIAGNOSIS — D229 Melanocytic nevi, unspecified: Secondary | ICD-10-CM

## 2024-05-28 DIAGNOSIS — Z79899 Other long term (current) drug therapy: Secondary | ICD-10-CM

## 2024-05-28 DIAGNOSIS — W908XXA Exposure to other nonionizing radiation, initial encounter: Secondary | ICD-10-CM

## 2024-05-28 DIAGNOSIS — Z1283 Encounter for screening for malignant neoplasm of skin: Secondary | ICD-10-CM

## 2024-05-28 DIAGNOSIS — L578 Other skin changes due to chronic exposure to nonionizing radiation: Secondary | ICD-10-CM

## 2024-05-28 DIAGNOSIS — D239 Other benign neoplasm of skin, unspecified: Secondary | ICD-10-CM

## 2024-05-28 DIAGNOSIS — Z86018 Personal history of other benign neoplasm: Secondary | ICD-10-CM

## 2024-05-28 DIAGNOSIS — Z808 Family history of malignant neoplasm of other organs or systems: Secondary | ICD-10-CM

## 2024-05-28 MED ORDER — KETOCONAZOLE 2 % EX CREA: TOPICAL_CREAM | CUTANEOUS | 11 refills | Status: AC

## 2024-05-28 MED ORDER — HYDROCORTISONE 2.5 % EX LOTN: TOPICAL_LOTION | CUTANEOUS | 11 refills | Status: AC

## 2024-05-28 NOTE — Progress Notes (Signed)
 Follow-Up Visit   Subjective  Patrick Neal is a 58 y.o. male who presents for the following: Skin Cancer Screening and Full Body Skin Exam  The patient presents for Total-Body Skin Exam (TBSE) for skin cancer screening and mole check. The patient has spots, moles and lesions to be evaluated, some may be new or changing and the patient may have concern these could be cancer.  The following portions of the chart were reviewed this encounter and updated as appropriate: medications, allergies, medical history  Review of Systems:  No other skin or systemic complaints except as noted in HPI or Assessment and Plan.  Objective  Well appearing patient in no apparent distress; mood and affect are within normal limits.  A full examination was performed including scalp, head, eyes, ears, nose, lips, neck, chest, axillae, abdomen, back, buttocks, bilateral upper extremities, bilateral lower extremities, hands, feet, fingers, toes, fingernails, and toenails. All findings within normal limits unless otherwise noted below.   Relevant physical exam findings are noted in the Assessment and Plan.  Nose x 1 Erythematous thin papules/macules with gritty scale.   Assessment & Plan   SKIN CANCER SCREENING PERFORMED TODAY.  ACTINIC DAMAGE - Chronic condition, secondary to cumulative UV/sun exposure - diffuse scaly erythematous macules with underlying dyspigmentation - Recommend daily broad spectrum sunscreen SPF 30+ to sun-exposed areas, reapply every 2 hours as needed.  - Staying in the shade or wearing long sleeves, sun glasses (UVA+UVB protection) and wide brim hats (4-inch brim around the entire circumference of the hat) are also recommended for sun protection.  - Call for new or changing lesions.  LENTIGINES, SEBORRHEIC KERATOSES, HEMANGIOMAS - Benign normal skin lesions - Benign-appearing - Call for any changes  MELANOCYTIC NEVI - Tan-brown and/or pink-flesh-colored symmetric macules  and papules - Benign appearing on exam today - Observation - Call clinic for new or changing moles - Recommend daily use of broad spectrum spf 30+ sunscreen to sun-exposed areas.   HISTORY OF DYSPLASTIC NEVI No evidence of recurrence today Recommend regular full body skin exams Recommend daily broad spectrum sunscreen SPF 30+ to sun-exposed areas, reapply every 2 hours as needed.  Call if any new or changing lesions are noted between office visits  FAMILY HISTORY OF SKIN CANCER What type(s): melanoma Who affected: sister and father  AK (ACTINIC KERATOSIS) Nose x 1 Actinic keratoses are precancerous spots that appear secondary to cumulative UV radiation exposure/sun exposure over time. They are chronic with expected duration over 1 year. A portion of actinic keratoses will progress to squamous cell carcinoma of the skin. It is not possible to reliably predict which spots will progress to skin cancer and so treatment is recommended to prevent development of skin cancer.  Recommend daily broad spectrum sunscreen SPF 30+ to sun-exposed areas, reapply every 2 hours as needed.  Recommend staying in the shade or wearing long sleeves, sun glasses (UVA+UVB protection) and wide brim hats (4-inch brim around the entire circumference of the hat). Call for new or changing lesions.  Destruction of lesion - Nose x 1 Complexity: simple   Destruction method: cryotherapy   Informed consent: discussed and consent obtained   Timeout:  patient name, date of birth, surgical site, and procedure verified Lesion destroyed using liquid nitrogen: Yes   Region frozen until ice ball extended beyond lesion: Yes   Outcome: patient tolerated procedure well with no complications   Post-procedure details: wound care instructions given    SEBORRHEIC DERMATITIS    DERMATOFIBROMA Exam:  Firm pink/brown papulenodule with dimple sign L lower pretibial below the knee Treatment Plan: A dermatofibroma is a benign  growth possibly related to trauma, such as an insect bite, cut from shaving, or inflamed acne-type bump.  Treatment options to remove include shave or excision with resulting scar and risk of recurrence.  Since benign-appearing and not bothersome, will observe for now.   SEBORRHEIC DERMATITIS at face and nose Exam: Pink patches with greasy scale at face and nose Chronic and persistent condition with duration or expected duration over one year. Condition is improving with treatment but not currently at goal. Seborrheic Dermatitis is a chronic persistent rash characterized by pinkness and scaling most commonly of the mid face but also can occur on the scalp (dandruff), ears; mid chest, mid back and groin.  It tends to be exacerbated by stress and cooler weather.  People who have neurologic disease may experience new onset or exacerbation of existing seborrheic dermatitis.  The condition is not curable but treatable and can be controlled. Treatment Plan: Re-start  HC 2.5% lotion QHS on M, W, F alternating with Continue Ketoconazole  2% cream Tues, Th, Sat QHS.  Return in about 1 year (around 05/28/2025) for TBSE - hx dysplastic neuvs.  I, Rosina Mayans, CMA, am acting as scribe for Alm Rhyme, MD .   Documentation: I have reviewed the above documentation for accuracy and completeness, and I agree with the above.  Alm Rhyme, MD

## 2024-05-28 NOTE — Patient Instructions (Signed)

## 2025-06-03 ENCOUNTER — Ambulatory Visit: Admitting: Dermatology
